# Patient Record
Sex: Female | Born: 1980
Health system: Southern US, Community
[De-identification: ages and names within clinical notes are randomized; demographics above are authoritative.]

## PROBLEM LIST (undated history)

## (undated) ENCOUNTER — Inpatient Hospital Stay (HOSPITAL_COMMUNITY): Payer: Self-pay

## (undated) DIAGNOSIS — O139 Gestational [pregnancy-induced] hypertension without significant proteinuria, unspecified trimester: Secondary | ICD-10-CM

## (undated) DIAGNOSIS — N907 Vulvar cyst: Secondary | ICD-10-CM

## (undated) DIAGNOSIS — A599 Trichomoniasis, unspecified: Secondary | ICD-10-CM

## (undated) DIAGNOSIS — N750 Cyst of Bartholin's gland: Secondary | ICD-10-CM

## (undated) DIAGNOSIS — I471 Supraventricular tachycardia, unspecified: Secondary | ICD-10-CM

## (undated) HISTORY — PX: WISDOM TOOTH EXTRACTION: SHX21

## (undated) HISTORY — DX: Gestational (pregnancy-induced) hypertension without significant proteinuria, unspecified trimester: O13.9

## (undated) HISTORY — PX: DILATION AND CURETTAGE OF UTERUS: SHX78

## (undated) HISTORY — DX: Trichomoniasis, unspecified: A59.9

## (undated) HISTORY — DX: Vulvar cyst: N90.7

---

## 2000-03-03 ENCOUNTER — Other Ambulatory Visit: Admission: RE | Admit: 2000-03-03 | Discharge: 2000-03-03 | Payer: Self-pay | Admitting: Obstetrics and Gynecology

## 2001-07-22 ENCOUNTER — Encounter: Payer: Self-pay | Admitting: Obstetrics and Gynecology

## 2001-07-22 ENCOUNTER — Inpatient Hospital Stay (HOSPITAL_COMMUNITY): Admission: AD | Admit: 2001-07-22 | Discharge: 2001-07-22 | Payer: Self-pay | Admitting: Obstetrics and Gynecology

## 2001-08-23 ENCOUNTER — Other Ambulatory Visit: Admission: RE | Admit: 2001-08-23 | Discharge: 2001-08-23 | Payer: Self-pay | Admitting: Obstetrics and Gynecology

## 2002-02-04 ENCOUNTER — Inpatient Hospital Stay (HOSPITAL_COMMUNITY): Admission: AD | Admit: 2002-02-04 | Discharge: 2002-02-04 | Payer: Self-pay | Admitting: Obstetrics and Gynecology

## 2002-03-07 ENCOUNTER — Encounter: Payer: Self-pay | Admitting: Obstetrics and Gynecology

## 2002-03-07 ENCOUNTER — Inpatient Hospital Stay (HOSPITAL_COMMUNITY): Admission: AD | Admit: 2002-03-07 | Discharge: 2002-03-07 | Payer: Self-pay | Admitting: Obstetrics and Gynecology

## 2002-03-15 ENCOUNTER — Inpatient Hospital Stay (HOSPITAL_COMMUNITY): Admission: AD | Admit: 2002-03-15 | Discharge: 2002-03-18 | Payer: Self-pay | Admitting: Obstetrics and Gynecology

## 2010-02-05 ENCOUNTER — Emergency Department (HOSPITAL_BASED_OUTPATIENT_CLINIC_OR_DEPARTMENT_OTHER): Admission: EM | Admit: 2010-02-05 | Discharge: 2010-02-05 | Payer: Self-pay | Admitting: Emergency Medicine

## 2010-07-24 ENCOUNTER — Emergency Department (HOSPITAL_BASED_OUTPATIENT_CLINIC_OR_DEPARTMENT_OTHER)
Admission: EM | Admit: 2010-07-24 | Discharge: 2010-07-24 | Payer: Self-pay | Source: Home / Self Care | Admitting: Emergency Medicine

## 2010-10-29 LAB — URINALYSIS, ROUTINE W REFLEX MICROSCOPIC
Bilirubin Urine: NEGATIVE
Glucose, UA: NEGATIVE mg/dL
Ketones, ur: NEGATIVE mg/dL
Nitrite: POSITIVE — AB
Protein, ur: NEGATIVE mg/dL
Specific Gravity, Urine: 1.004 — ABNORMAL LOW (ref 1.005–1.030)
Urobilinogen, UA: 1 mg/dL (ref 0.0–1.0)
pH: 7.5 (ref 5.0–8.0)

## 2010-10-29 LAB — URINE MICROSCOPIC-ADD ON

## 2010-10-29 LAB — PREGNANCY, URINE: Preg Test, Ur: NEGATIVE

## 2011-01-03 NOTE — H&P (Signed)
   NAMESHARISA, Robin Peters                            ACCOUNT NO.:  [id number]   MEDICAL RECORD NO.:  [id number]                   PATIENT TYPE:  INP   LOCATION:  9133                                 FACILITY:  [facility]   PHYSICIAN:  Renaldo Reel. Emilee Hero, C.N.M.             DATE OF BIRTH:  07-13-1981   DATE OF ADMISSION:  03/15/2002  DATE OF DISCHARGE:  03/18/2002                                HISTORY & PHYSICAL   No dictation                                               Chip Boer L. Emilee Hero, C.N.M.    VLL/MEDQ  D:  03/15/2002  T:  03/18/2002  Job:  [id number]

## 2011-01-03 NOTE — H&P (Signed)
[facility]  Patient:    Robin Peters, Robin Peters Visit Number: [id number] MRN: [id number]          Service Type: OBS Location: [facility] Attending Physician:  Jaymes Graff A Dictated by:   Saverio Danker, C.N.M. Admit Date:  03/07/2002                           History and Physical  HISTORY OF PRESENT ILLNESS:   Ms. Cooperwood is a 30 year old single black female, gravida 3, para 1-0-1-1, at 38-1/[redacted] weeks gestation by ultrasound who presents complaining of persistent abdominal cramping and diarrhea over the weekend. She reports that today she has had diarrhea almost every 15 minutes but states that the cramps do not feel like labor; they are just very uncomfortable. She was evaluated at [facility] office earlier this morning and was at that time found to be 2 cm and about 50% effaced.  She was subsequently sent to maternity admissions for further monitoring and evaluation.  She reports that the pain and cramps continue and at time feel like the early part of labor with her last baby.  She reports positive fetal movement but states it is a little less than usual.  She denies any headache for visual disturbances.  Her pregnancy has been followed at [facility] by the certified nurse midwife service and has been at risk for 1) questionable LMP, 2) history of preeclampsia with her last pregnancy, 3) history of postpartum depression with her last delivery, and history of tachycardia with a syncopal episode with her last pregnancy.  She has had none of that with this pregnancy.  Her group B strep was negative.  OBSTETRICAL/GYNECOLOGIC HISTORY:  She is a gravida 3, para 1-0-1-1 who had a miscarriage in June 1998 with no complications.  She delivered a viable female infant in May 1999 who weighed 7 pounds 6 ounces at [redacted] weeks gestation following a 6-hour labor.  She was induced with that pregnancy  for preeclampsia.  She reports that she did have postpartum depression but did not require treatment.  ALLERGIES:                    No known drug allergies.  PAST MEDICAL HISTORY:         She reports having had the usual childhood diseases.  She reports a history of varicose veins and an episode of tachycardia with her last pregnancy that caused syncope but had no other issues and has not had any of those symptoms with this pregnancy.  She has reported two kidney infections in the past, occasional urinary tract infection, and a history of ulcers five to six years ago.  Her only surgery was for wisdom teeth removed at age 60.  FAMILY HISTORY:               Unknown as patient is adopted.  GENETIC HISTORY:              Negative.  SOCIAL HISTORY:               She is single.  The father of the baby is Daphine Deutscher who is involved and supportive.  They are both employed full time.  They deny illicit drug use, alcohol, or smoking with this pregnancy.  PRENATAL LABORATORY DATA:     Her blood type is A positive.  Her antibody screen is negative.  Sickle cell  trait is negative.  Syphilis is nonreactive. Rubella is immune.  Hepatitis B surface antigen is negative.  HIV is nonreactive.  GC and chlamydia were both negative.  Pap was within normal limits.  One-hour glucola was elevated at 144.  Three-hour GTT was within normal range.  Her maternal serum alpha-fetoprotein was within normal range, and her Group B strep was negative.  PHYSICAL EXAMINATION:  VITAL SIGNS:                  Stable. She is afebrile.  HEENT:                        Grossly within normal limits.  HEART:                        Regular rhythm and rate.  CHEST:                        Clear.  BREASTS:                      Soft and nontender.  ABDOMEN:                      Gravid.  Fetal heart rate is overall reactive and reassuring, though she did have a single variable down to the 80s that lasted for approximately 3  minutes total then returned spontaneously to baseline and has subsequently been reactive and reassuring.  She has had uterine contractions throughout her observation initially every 2 to 3 minutes but after IV fluids, every 5 to 7 minutes and are mild to moderate.  PELVIC:                       Her cervix is still 2, 50%, vertex, -1, anterior, with intact membranes.  EXTREMITIES:                  Within normal limits.  LABORATORY DATA:              Urinalysis was negative.   Her lipase is 19. Her amylase is 103.  SGOT 13, SGPT 8.  WBC count 6.4, platelets 197.  ASSESSMENT:                   1. Intrauterine pregnancy at 38-1/[redacted] weeks                                  gestation.                               2. Probable gastrointestinal virus.                               3. Questionable prodromal labor.  PLAN:                         Per consultation with Dr. Normand Sloop is to admit for 23-hour observation for therapeutic rest, biophysical profile, and AFI within normal limits.  Will also continue IV hydration. Dictated by:   Vance Gather Duplantis, C.N.M. Attending Physician:  Michael Litter DD:  03/07/02 TD:  03/07/02 Job: [id number]

## 2011-01-03 NOTE — H&P (Signed)
[name], Robin Peters                            ACCOUNT NO.:  [id number]   MEDICAL RECORD NO.:  [id number]                   PATIENT TYPE:  INP   LOCATION:  9169                                 FACILITY:  [facility]   PHYSICIAN:  Renaldo Reel. Emilee Hero, C.N.M.             DATE OF BIRTH:  10/28/1980   DATE OF ADMISSION:  03/15/2002  DATE OF DISCHARGE:                                HISTORY & PHYSICAL   HISTORY OF PRESENT ILLNESS:  The patient is a 30 year old, gravida 3, para 1-  0-1-1 at 39-5/7th weeks who presents from the office with a history of a  nonreactive NST.  Blood pressures in the 130/90 and 122/86 range and uterine  contractions every three minutes.  She denies any headache, vaginal  bleeding, or epigastric pain.  The pregnancy has been remarkable for history  of PIH with her last pregnancy, questionable last menstrual period, history  of postpartum depression, history of tachycardia with syncope in her  previous pregnancy for which she was treated with Inderal but had no  pathological diagnosis, pelvic pain in the third trimester which makes it  very difficult for the patient to walk.   PRENATAL LABORATORY DATA:  Blood type is A positive.  Rh antibody negative.  VDRL nonreactive.  Rubella titer positive.  Hepatitis B surface antigen  negative.  Sickle cell test was negative.  HIV was nonreactive.  Pap was  normal.  GC and Chlamydia cultures were negative.  Glucose challenge was  elevated at 144, three hour GTT was normal, AFP was normal.  Hemoglobin upon  entering the practice was 13.3.  It was 10.9 at 27 weeks.  EDC of March 17, 2002 was established by ultrasound at six weeks secondary to questionable  LMP.  Group B strep culture was negative at 36 weeks.   PRESENT OBSTETRICAL HISTORY:  The patient had entered here at approximately  10 to 11 weeks.  A release of information was sent to her previous  cardiologist in [facility] for follow up on her tachycardia and syncope from  her  previous pregnancy.  She had sporadic sinus tachycardia and was treated  with Inderal during that pregnancy.  She had an ultrasound at 19 weeks.  After her six week ultrasound that showed normal growth and fluid, she had  an elevated one hour GTT that was abnormal and a three hour GTT that was  normal.  She was placed on Zantac at 28 weeks for reflux.  The rest of her  pregnancy was essentially uncomplicated except for significant pelvic pubic  symphysis pain that occurred in the latter part of her pregnancy.  She also  continued to have some diarrhea sporadically.  She was seen at maternity  admissions at 38 weeks for significant left lower quadrant pain.  Findings  were negative.  Her blood pressure at the office today was 130/90 and then  122/86.  She  was seen for serial BP, catheterized UA, and PIH labs.  She was  also found to be 3 cm and contracting.   PAST OBSTETRICAL HISTORY:  In 1998, she had a spontaneous miscarriage at 8  weeks and in 1999 she had a vaginal birth of a female infant, weight 7  pounds 6 ounces, at 39 weeks.  She was in labor six hours.  She had epidural  anesthesia.  She was induced for PIH and had no complications.  She did have  some postpartum blues after that delivery but no other problems.   PAST MEDICAL HISTORY:  She is a previous Ortho-Tri-Cyclen user.  She has  occasional yeast infections and had BV x 1.  She reports usual childhood  illnesses.  She had tachycardia with syncope last pregnancy and was treated  with bed rest and monitored her medication.  She has a varicose vein.  She  had a history of questionable ulcers five to six years ago.  She has  occasional UTIs.  She has had pyelonephritis x 2 that was treated with  outpatient antibiotics by mouth.   PAST SURGICAL HISTORY:  Surgical history includes wisdom teeth removed at  age 16.  The only other hospitalization was for childbirth.   GENETIC HISTORY:  Unremarkable in terms of known history;  however, the  patient is adopted.   FAMILY HISTORY:  Unknown.   SOCIAL HISTORY:  The patient is single.  The father of the baby is involved  and supportive.  His name is Medical sales representative.  He is involved and  supportive.  He is African-American.  The patient has a high school  education.  He is employed in [employer].  Her partner has one year of  Scientist, product/process development.  He is Veterinary surgeon.  She has been followed by the  certified nurse midwife service at [facility].  She denies any  alcohol, drug, or tobacco use during this pregnancy.   PHYSICAL EXAMINATION:   VITAL SIGNS:  Blood pressures range from 110/70 to 140/90.  Other vital  signs are stable.   HEENT:  Within normal limits.   LUNGS:  Breath sounds are clear.   HEART:  Regular rate and rhythm without murmur.   BREASTS:  Soft and nontender.   ABDOMEN:  Fundal height is approximately 38 cm.  Estimated fetal weight is 7  to 8 pounds.  Uterine contractions every three to five minutes, mild to  moderate quality.   PELVIC:  Cervical examination is 3, 60% vertex, and at a -1 station with a  bulging bag of water.  Her cervix had been 2 cm.  After two hours of  observation, the cervix remained essentially the same.  The uterine  contractions then were every three minutes, more consistently.   NEUROLOGIC:  Deep tendon reflexes were 2+ without clonus with a trace edema  noted.   LABORATORY DATA:  Catheterization UA shows a specific gravity of 1010 and 15  of ketones.  No protein was noted.  The pH labs were all within normal  limits.  Fetal monitor shows reactive fetal heart rate tracing with no  decelerations.   IMPRESSION:  1. Intrauterine pregnancy at 39-5/7th weeks.  2. Labile blood pressures.  3. Probable early labor.  4. History of pregnancy-induced hypertension.   PLAN:  1. Admit to birthing suite per consult with Dr. Pierre Bali. Dillard, who is    attending physician.  2. Routine certified nurse midwife  orders.  3. Plan  for labor support with artificial rupture of membranes or Pitocin     augmentation p.r.n. and with close observation of maternal fetal status     and reevaluation of blood pressure after delivery.                                              Renaldo Reel Emilee Hero, C.N.M.   VLL/MEDQ  D:  03/15/2002  T:  03/15/2002  Job:  [id number]

## 2012-02-14 ENCOUNTER — Emergency Department (HOSPITAL_BASED_OUTPATIENT_CLINIC_OR_DEPARTMENT_OTHER): Payer: Self-pay

## 2012-02-14 ENCOUNTER — Emergency Department (HOSPITAL_BASED_OUTPATIENT_CLINIC_OR_DEPARTMENT_OTHER)
Admission: EM | Admit: 2012-02-14 | Discharge: 2012-02-14 | Disposition: A | Discharge status: Home / Self Care | Payer: Self-pay | Attending: Emergency Medicine | Admitting: Emergency Medicine

## 2012-02-14 DIAGNOSIS — A599 Trichomoniasis, unspecified: Secondary | ICD-10-CM | POA: Insufficient documentation

## 2012-02-14 DIAGNOSIS — N907 Vulvar cyst: Secondary | ICD-10-CM

## 2012-02-14 DIAGNOSIS — O98819 Other maternal infectious and parasitic diseases complicating pregnancy, unspecified trimester: Secondary | ICD-10-CM | POA: Insufficient documentation

## 2012-02-14 DIAGNOSIS — Z3490 Encounter for supervision of normal pregnancy, unspecified, unspecified trimester: Secondary | ICD-10-CM

## 2012-02-14 DIAGNOSIS — R609 Edema, unspecified: Secondary | ICD-10-CM | POA: Insufficient documentation

## 2012-02-14 LAB — CBC WITH DIFFERENTIAL/PLATELET
Basophils Absolute: 0 10*3/uL (ref 0.0–0.1)
Eosinophils Relative: 1 % (ref 0–5)
Lymphocytes Relative: 20 % (ref 12–46)
Neutro Abs: 4.5 10*3/uL (ref 1.7–7.7)
Neutrophils Relative %: 72 % (ref 43–77)
Platelets: 302 10*3/uL (ref 150–400)
RDW: 12.6 % (ref 11.5–15.5)
WBC: 6.2 10*3/uL (ref 4.0–10.5)

## 2012-02-14 LAB — WET PREP, GENITAL: Yeast Wet Prep HPF POC: NONE SEEN

## 2012-02-14 LAB — BASIC METABOLIC PANEL
CO2: 21 mEq/L (ref 19–32)
Calcium: 9.2 mg/dL (ref 8.4–10.5)
Sodium: 137 mEq/L (ref 135–145)

## 2012-02-14 LAB — ABO/RH: ABO/RH(D): A POS

## 2012-02-14 LAB — URINALYSIS, ROUTINE W REFLEX MICROSCOPIC
Glucose, UA: NEGATIVE mg/dL
Hgb urine dipstick: NEGATIVE
Ketones, ur: NEGATIVE mg/dL
Protein, ur: NEGATIVE mg/dL

## 2012-02-14 LAB — HCG, QUANTITATIVE, PREGNANCY: hCG, Beta Chain, Quant, S: 66188 m[IU]/mL — ABNORMAL HIGH (ref <5)

## 2012-02-14 LAB — OB RESULTS CONSOLE RPR: RPR: NONREACTIVE

## 2012-02-14 MED ORDER — METRONIDAZOLE 500 MG PO TABS
2000.0000 mg | ORAL_TABLET | Freq: Once | ORAL | Status: AC
  Administered 2012-02-14: 2000 mg via ORAL
  Filled 2012-02-14: qty 4

## 2012-02-14 NOTE — ED Provider Notes (Signed)
History     CSN: [id number]  Arrival date & time 02/14/12  1215   First MD Initiated Contact with Patient 02/14/12 1228      Chief Complaint  Patient presents with  . Abdominal Pain  . Edema  . Vaginal Bleeding    (Consider location/radiation/quality/duration/timing/severity/associated sxs/prior treatment) HPI Comments: Pt states that she is bleeding intermittently:pt states that she was yesterday, but she doesn't think she is today:pt states that she has had intermittent swelling to her labia over the last couple of months:pt states that she started having generalized lower abdominal pain in the last couple of days:pt states that she had an abortion in January and she had a normal period in April but has not had consistent bleeding since then  Patient is a 30 y.o. female presenting with abdominal pain. The history is provided by the patient. No language interpreter was used.  Abdominal Pain The primary symptoms of the illness include abdominal pain, dysuria and vaginal bleeding. The primary symptoms of the illness do not include fever, nausea, vomiting or diarrhea. The current episode started more than 2 days ago. The onset of the illness was gradual. The problem has not changed since onset.   No past medical history on file.  No past surgical history on file.  No family history on file.  History  Substance Use Topics  . Smoking status: Not on file  . Smokeless tobacco: Not on file  . Alcohol Use: Not on file    OB History    No data available      Review of Systems  Constitutional: Negative for fever.  Respiratory: Negative.   Cardiovascular: Negative.   Gastrointestinal: Positive for abdominal pain. Negative for nausea, vomiting and diarrhea.  Genitourinary: Positive for dysuria and vaginal bleeding.    Allergies  Review of patient's allergies indicates no known allergies.  Home Medications  No current outpatient prescriptions on file.  LMP  12/15/2011  Physical Exam  Nursing note and vitals reviewed. Constitutional: She is oriented to person, place, and time. She appears well-developed and well-nourished.  Cardiovascular: Normal rate and regular rhythm.   Pulmonary/Chest: Effort normal and breath sounds normal.  Abdominal: Soft. Bowel sounds are normal. There is tenderness in the left lower quadrant.  Genitourinary:       Pt has a fluctuant no red area to the labia:area is non tender to palpation:pt has malodorous discharge:-cmt  Musculoskeletal: Normal range of motion.  Neurological: She is alert and oriented to person, place, and time.  Skin: Skin is warm and dry.  Psychiatric: She has a normal mood and affect.    ED Course  Procedures (including critical care time)  Labs Reviewed  PREGNANCY, URINE - Abnormal; Notable for the following:    Preg Test, Ur POSITIVE (*)     All other components within normal limits  WET PREP, GENITAL - Abnormal; Notable for the following:    Trich, Wet Prep FEW (*)     Clue Cells Wet Prep HPF POC FEW (*)     WBC, Wet Prep HPF POC MODERATE (*)     All other components within normal limits  BASIC METABOLIC PANEL - Abnormal; Notable for the following:    Potassium 3.3 (*)     All other components within normal limits  HCG, QUANTITATIVE, PREGNANCY - Abnormal; Notable for the following:    hCG, Beta Chain, [place] [address] (*)     All other components within normal limits  URINALYSIS, ROUTINE W REFLEX  MICROSCOPIC  CBC WITH DIFFERENTIAL  ABO/RH  GC/CHLAMYDIA PROBE AMP, GENITAL   US Ob Comp Less 14 Wks  02/14/2012  *RADIOLOGY REPORT*  Clinical Data: Vaginal bleeding.  OBSTETRIC <14 WK ULTRASOUND, TRANSVAGINAL OB US  Technique:  Transabdominal and transvaginal ultrasound was performed for evaluation of the gestation as well as the maternal uterus and adnexal regions.  Findings:  There is a single intrauterine gestational sac containing embryo. The cardiac activity is identified.  The heart  rate is equal to 160 beats per minute.  Crown-rump length measures 46.6 mm.  This corresponds to a 11-week- 3-day gestation.  [facility] 09/01/2012.  Maternal uterus/adnexae: The ovaries appear normal.  There is no free fluid.  IMPRESSION:  1.  Single living intrauterine gestation with an estimated gestational age of [redacted] weeks and 3 days.  This is concordant with the clinical gestational age.  Original Report Authenticated By: Rosealee Albee, M.D.     1. Pregnancy   2. Trichimoniasis   3. Labial cyst       MDM  Discussed pregnancy with pt and follow up for care:pt instructed to take vitamins:pt treated here for trich:other cultures sent        Teressa Lower, NP 02/14/12 1552

## 2012-02-14 NOTE — ED Notes (Signed)
Lower abdominal pain, swelling to labia, vaginal bleeding intermittently x several days.  Foul smelling urine.

## 2012-02-14 NOTE — ED Notes (Signed)
Pt given instructions for f/u- no new rx given

## 2012-02-14 NOTE — Discharge Instructions (Signed)
ABCs of Pregnancy A Antepartum care is very important. Be sure you see your doctor and get prenatal care as soon as you think you are pregnant. At this time, you will be tested for infection, genetic abnormalities and potential problems with you and the pregnancy. This is the time to discuss diet, exercise, work, medications, labor, pain medication during labor and the possibility of a cesarean delivery. Ask any questions that may concern you. It is important to see your doctor regularly throughout your pregnancy. Avoid exposure to toxic substances and chemicals - such as cleaning solvents, lead and mercury, some insecticides, and paint. Pregnant women should avoid exposure to paint fumes, and fumes that cause you to feel ill, dizzy or faint. When possible, it is a good idea to have a pre-pregnancy consultation with your caregiver to begin some important recommendations your caregiver suggests such as, taking folic acid, exercising, quitting smoking, avoiding alcoholic beverages, etc. B Breastfeeding is the healthiest choice for both you and your baby. It has many nutritional benefits for the baby and health benefits for the mother. It also creates a very tight and loving bond between the baby and mother. Talk to your doctor, your family and friends, and your employer about how you choose to feed your baby and how they can support you in your decision. Not all birth defects can be prevented, but a woman can take actions that may increase her chance of having a healthy baby. Many birth defects happen very early in pregnancy, sometimes before a woman even knows she is pregnant. Birth defects or abnormalities of any child in your or the father's family should be discussed with your caregiver. Get a good support bra as your breast size changes. Wear it especially when you exercise and when nursing.  C Celebrate the news of your pregnancy with the your spouse/father and family. Childbirth classes are helpful to  take for you and the spouse/father because it helps to understand what happens during the pregnancy, labor and delivery. Cesarean delivery should be discussed with your doctor so you are prepared for that possibility. The pros and cons of circumcision if it is a boy, should be discussed with your pediatrician. Cigarette smoking during pregnancy can result in low birth weight babies. It has been associated with infertility, miscarriages, tubal pregnancies, infant death (mortality) and poor health (morbidity) in childhood. Additionally, cigarette smoking may cause long-term learning disabilities. If you smoke, you should try to quit before getting pregnant and not smoke during the pregnancy. Secondary smoke may also harm a mother and her developing baby. It is a good idea to ask people to stop smoking around you during your pregnancy and after the baby is born. Extra calcium is necessary when you are pregnant and is found in your prenatal vitamin, in dairy products, green leafy vegetables and in calcium supplements. D A healthy diet according to your current weight and height, along with vitamins and mineral supplements should be discussed with your caregiver. Domestic abuse or violence should be made known to your doctor right away to get the situation corrected. Drink more water when you exercise to keep hydrated. Discomfort of your back and legs usually develops and progresses from the middle of the second trimester through to delivery of the baby. This is because of the enlarging baby and uterus, which may also affect your balance. Do not take illegal drugs. Illegal drugs can seriously harm the baby and you. Drink extra fluids (water is best) throughout pregnancy to help  your body keep up with the increases in your blood volume. Drink at least 6 to 8 glasses of water, fruit juice, or milk each day. A good way to know you are drinking enough fluid is when your urine looks almost like clear water or is very light  yellow.  E Eat healthy to get the nutrients you and your unborn baby need. Your meals should include the five basic food groups. Exercise (30 minutes of light to moderate exercise a day) is important and encouraged during pregnancy, if there are no medical problems or problems with the pregnancy. Exercise that causes discomfort or dizziness should be stopped and reported to your caregiver. Emotions during pregnancy can change from being ecstatic to depression and should be understood by you, your partner and your family. F Fetal screening with ultrasound, amniocentesis and monitoring during pregnancy and labor is common and sometimes necessary. Take 400 micrograms of folic acid daily both before, when possible, and during the first few months of pregnancy to reduce the risk of birth defects of the brain and spine. All women who could possibly become pregnant should take a vitamin with folic acid, every day. It is also important to eat a healthy diet with fortified foods (enriched grain products, including cereals, rice, breads, and pastas) and foods with natural sources of folate (orange juice, green leafy vegetables, beans, peanuts, broccoli, asparagus, peas, and lentils). The father should be involved with all aspects of the pregnancy including, the prenatal care, childbirth classes, labor, delivery, and postpartum time. Fathers may also have emotional concerns about being a father, financial needs, and raising a family. G Genetic testing should be done appropriately. It is important to know your family and the father's history. If there have been problems with pregnancies or birth defects in your family, report these to your doctor. Also, genetic counselors can talk with you about the information you might need in making decisions about having a family. You can call a major medical center in your area for help in finding a board-certified genetic counselor. Genetic testing and counseling should be done  before pregnancy when possible, especially if there is a history of problems in the mother's or father's family. Certain ethnic backgrounds are more at risk for genetic defects. H Get familiar with the hospital where you will be having your baby. Get to know how long it takes to get there, the labor and delivery area, and the hospital procedures. Be sure your medical insurance is accepted there. Get your home ready for the baby including, clothes, the baby's room (when possible), furniture and car seat. Hand washing is important throughout the day, especially after handling raw meat and poultry, changing the baby's diaper or using the bathroom. This can help prevent the spread of many bacteria and viruses that cause infection. Your hair may become dry and thinner, but will return to normal a few weeks after the baby is born. Heartburn is a common problem that can be treated by taking antacids recommended by your caregiver, eating smaller meals 5 or 6 times a day, not drinking liquids when eating, drinking between meals and raising the head of your bed 2 to 3 inches. I Insurance to cover you, the baby, doctor and hospital should be reviewed so that you will be prepared to pay any costs not covered by your insurance plan. If you do not have medical insurance, there are usually clinics and services available for you in your community. Take 30 milligrams of iron during  your pregnancy as prescribed by your doctor to reduce the risk of low red blood cells (anemia) later in pregnancy. All women of childbearing age should eat a diet rich in iron. J There should be a joint effort for the mother, father and any other children to adapt to the pregnancy financially, emotionally, and psychologically during the pregnancy. Join a support group for moms-to-be. Or, join a class on parenting or childbirth. Have the family participate when possible. K Know your limits. Let your caregiver know if you experience any of the  following:   Pain of any kind.   Strong cramps.   You develop a lot of weight in a short period of time (5 pounds in 3 to 5 days).   Vaginal bleeding, leaking of amniotic fluid.   Headache, vision problems.   Dizziness, fainting, shortness of breath.   Chest pain.   Fever of 102 F (38.9 C) or higher.   Gush of clear fluid from your vagina.   Painful urination.   Domestic violence.   Irregular heartbeat (palpitations).   Rapid beating of the heart (tachycardia).   Constant feeling sick to your stomach (nauseous) and vomiting.   Trouble walking, fluid retention (edema).   Muscle weakness.   If your baby has decreased activity.   Persistent diarrhea.   Abnormal vaginal discharge.   Uterine contractions at 20-minute intervals.   Back pain that travels down your leg.  L Learn and practice that what you eat and drink should be in moderation and healthy for you and your baby. Legal drugs such as alcohol and caffeine are important issues for pregnant women. There is no safe amount of alcohol a woman can drink while pregnant. Fetal alcohol syndrome, a disorder characterized by growth retardation, facial abnormalities, and central nervous system dysfunction, is caused by a woman's use of alcohol during pregnancy. Caffeine, found in tea, coffee, soft drinks and chocolate, should also be limited. Be sure to read labels when trying to cut down on caffeine during pregnancy. More than 200 foods, beverages, and over-the-counter medications contain caffeine and have a high salt content! There are coffees and teas that do not contain caffeine. M Medical conditions such as diabetes, epilepsy, and high blood pressure should be treated and kept under control before pregnancy when possible, but especially during pregnancy. Ask your caregiver about any medications that may need to be changed or adjusted during pregnancy. If you are currently taking any medications, ask your caregiver if it  is safe to take them while you are pregnant or before getting pregnant when possible. Also, be sure to discuss any herbs or vitamins you are taking. They are medicines, too! Discuss with your doctor all medications, prescribed and over-the-counter, that you are taking. During your prenatal visit, discuss the medications your doctor may give you during labor and delivery. N Never be afraid to ask your doctor or caregiver questions about your health, the progress of the pregnancy, family problems, stressful situations, and recommendation for a pediatrician, if you do not have one. It is better to take all precautions and discuss any questions or concerns you may have during your office visits. It is a good idea to write down your questions before you visit the doctor. O Over-the-counter cough and cold remedies may contain alcohol or other ingredients that should be avoided during pregnancy. Ask your caregiver about prescription, herbs or over-the-counter medications that you are taking or may consider taking while pregnant.  P Physical activity during pregnancy can  benefit both you and your baby by lessening discomfort and fatigue, providing a sense of well-being, and increasing the likelihood of early recovery after delivery. Light to moderate exercise during pregnancy strengthens the belly (abdominal) and back muscles. This helps improve posture. Practicing yoga, walking, swimming, and cycling on a stationary bicycle are usually safe exercises for pregnant women. Avoid scuba diving, exercise at high altitudes (over 3000 feet), skiing, horseback riding, contact sports, etc. Always check with your doctor before beginning any kind of exercise, especially during pregnancy and especially if you did not exercise before getting pregnant. Q Queasiness, stomach upset and morning sickness are common during pregnancy. Eating a couple of crackers or dry toast before getting out of bed. Foods that you normally love may  make you feel sick to your stomach. You may need to substitute other nutritious foods. Eating 5 or 6 small meals a day instead of 3 large ones may make you feel better. Do not drink with your meals, drink between meals. Questions that you have should be written down and asked during your prenatal visits. R Read about and make plans to baby-proof your home. There are important tips for making your home a safer environment for your baby. Review the tips and make your home safer for you and your baby. Read food labels regarding calories, salt and fat content in the food. S Saunas, hot tubs, and steam rooms should be avoided while you are pregnant. Excessive high heat may be harmful during your pregnancy. Your caregiver will screen and examine you for sexually transmitted diseases and genetic disorders during your prenatal visits. Learn the signs of labor. Sexual relations while pregnant is safe unless there is a medical or pregnancy problem and your caregiver advises against it. T Traveling long distances should be avoided especially in the third trimester of your pregnancy. If you do have to travel out of state, be sure to take a copy of your medical records and medical insurance plan with you. You should not travel long distances without seeing your doctor first. Most airlines will not allow you to travel after 36 weeks of pregnancy. Toxoplasmosis is an infection caused by a parasite that can seriously harm an unborn baby. Avoid eating undercooked meat and handling cat litter. Be sure to wear gloves when gardening. Tingling of the hands and fingers is not unusual and is due to fluid retention. This will go away after the baby is born. U Womb (uterus) size increases during the first trimester. Your kidneys will begin to function more efficiently. This may cause you to feel the need to urinate more often. You may also leak urine when sneezing, coughing or laughing. This is due to the growing uterus pressing  against your bladder, which lies directly in front of and slightly under the uterus during the first few months of pregnancy. If you experience burning along with frequency of urination or bloody urine, be sure to tell your doctor. The size of your uterus in the third trimester may cause a problem with your balance. It is advisable to maintain good posture and avoid wearing high heels during this time. An ultrasound of your baby may be necessary during your pregnancy and is safe for you and your baby. V Vaccinations are an important concern for pregnant women. Get needed vaccines before pregnancy. Center for Disease Control (http://www.wolf.info/) has clear guidelines for the use of vaccines during pregnancy. Review the list, be sure to discuss it with your doctor. Prenatal vitamins are helpful  and healthy for you and the baby. Do not take extra vitamins except what is recommended. Taking too much of certain vitamins can cause overdose problems. Continuous vomiting should be reported to your caregiver. Varicose veins may appear especially if there is a family history of varicose veins. They should subside after the delivery of the baby. Support hose helps if there is leg discomfort. W Being overweight or underweight during pregnancy may cause problems. Try to get within 15 pounds of your ideal weight before pregnancy. Remember, pregnancy is not a time to be dieting! Do not stop eating or start skipping meals as your weight increases. Both you and your baby need the calories and nutrition you receive from a healthy diet. Be sure to consult with your doctor about your diet. There is a formula and diet plan available depending on whether you are overweight or underweight. Your caregiver or nutritionist can help and advise you if necessary. X Avoid X-rays. If you must have dental work or diagnostic tests, tell your dentist or physician that you are pregnant so that extra care can be taken. X-rays should only be taken when  the risks of not taking them outweigh the risk of taking them. If needed, only the minimum amount of radiation should be used. When X-rays are necessary, protective lead shields should be used to cover areas of the body that are not being X-rayed. Y Your baby loves you. Breastfeeding your baby creates a loving and very close bond between the two of you. Give your baby a healthy environment to live in while you are pregnant. Infants and children require constant care and guidance. Their health and safety should be carefully watched at all times. After the baby is born, rest or take a nap when the baby is sleeping. Z Get your ZZZs. Be sure to get plenty of rest. Resting on your side as often as possible, especially on your left side is advised. It provides the best circulation to your baby and helps reduce swelling. Try taking a nap for 30 to 45 minutes in the afternoon when possible. After the baby is born rest or take a nap when the baby is sleeping. Try elevating your feet for that amount of time when possible. It helps the circulation in your legs and helps reduce swelling.  Most information courtesy of the CDC. Document Released: 08/04/2005 Document Revised: 07/24/2011 Document Reviewed: 04/18/2009 [facility] Patient Information 2012 [place], [place].Pregnancy If you are planning on getting pregnant, it is a good idea to make a preconception appointment with your care- giver to discuss having a healthy lifestyle before getting pregnant. Such as, diet, weight, exercise, taking prenatal vitamins especially folic acid (it helps prevent brain and spinal cord defects), avoiding alcohol, smoking and illegal drugs, medical problems (diabetes, convulsions), family history of genetic problems, working conditions and immunizations. It is better to have knowledge of these things and do something about them before getting pregnant. In your pregnancy, it is important to follow certain guidelines to have a healthy baby.  It is very important to get good prenatal care and follow your caregiver's instructions. Prenatal care includes all the medical care you receive before your baby's birth. This helps to prevent problems during the pregnancy and childbirth. HOME CARE INSTRUCTIONS   Start your prenatal visits by the 12th week of pregnancy or before when possible. They are usually scheduled monthly at first. They are more often in the last 2 months before delivery. It is important that you keep your  caregiver's appointments and follow your caregiver's instructions regarding medication use, exercise, and diet.   During pregnancy, you are providing food for you and your baby. Eat a regular, well-balanced diet. Choose foods such as meat, fish, milk and other dairy products, vegetables, fruits, whole-grain breads and cereals. Your caregiver will inform you of the ideal weight gain depending on your current height and weight. Drink lots of liquids. Try to drink 8 glasses of water a day.   Alcohol is associated with a number of birth defects including fetal alcohol syndrome. It is best to avoid alcohol completely. Smoking will cause low birth rate and prematurity. Use of alcohol and nicotine during your pregnancy also increases the chances that your child will be chemically dependent later in their life and may contribute to SIDS (Sudden Infant Death Syndrome).   Do not use illegal drugs.   Only take prescription or over-the-counter medications that are recommended by your caregiver. Other medications can cause genetic and physical problems in the baby.   Morning sickness can often be helped by keeping soda crackers at the bedside. Eat a couple before arising in the morning.   A sexual relationship may be continued until near the end of pregnancy if there are no other problems such as early (premature) leaking of amniotic fluid from the membranes, vaginal bleeding, painful intercourse or belly (abdominal) pain.   Exercise  regularly. Check with your caregiver if you are unsure of the safety of some of your exercises.   Do not use hot tubs, steam rooms or saunas. These increase the risk of fainting or passing out and hurting yourself and the baby. Swimming is OK for exercise. Get plenty of rest, including afternoon naps when possible especially in the third trimester.   Avoid toxic odors and chemicals.   Do not wear high heels. They may cause you to lose your balance and fall.   Do not lift over 5 pounds. If you do lift anything, lift with your legs and thighs, not your back.   Avoid long trips, especially in the third trimester.   If you have to travel out of the city or state, take a copy of your medical records with you.  SEEK IMMEDIATE MEDICAL CARE IF:   You develop an unexplained oral temperature above 102 F (38.9 C), or as your caregiver suggests.   You have leaking of fluid from the vagina. If leaking membranes are suspected, take your temperature and inform your caregiver of this when you call.   There is vaginal spotting or bleeding. Notify your caregiver of the amount and how many pads are used.   You continue to feel sick to your stomach (nauseous) and have no relief from remedies suggested, or you throw up (vomit) blood or coffee ground like materials.   You develop upper abdominal pain.   You have round ligament discomfort in the lower abdominal area. This still must be evaluated by your caregiver.   You feel contractions of the uterus.   You do not feel the baby move, or there is less movement than before.   You have painful urination.   You have abnormal vaginal discharge.   You have persistent diarrhea.   You get a severe headache.   You have problems with your vision.   You develop muscle weakness.   You feel dizzy and faint.   You develop shortness of breath.   You develop chest pain.   You have back pain that travels down to your  leg and feet.   You feel irregular  or a very fast heartbeat.   You develop excessive weight gain in a short period of time (5 pounds in 3 to 5 days).   You are involved with a domestic violence situation.  Document Released: 08/04/2005 Document Revised: 07/24/2011 Document Reviewed: 01/26/2009 [facility] Patient Information 2012 [place], [place].

## 2012-02-14 NOTE — ED Notes (Signed)
The pelvic cart is set up and ready for the doctor to use.

## 2012-02-18 NOTE — ED Provider Notes (Signed)
Medical screening examination/treatment/procedure(s) were performed by non-physician practitioner and as supervising physician I was immediately available for consultation/collaboration.  Donovan Gatchel, MD 02/18/12 1316 

## 2012-03-24 ENCOUNTER — Inpatient Hospital Stay (HOSPITAL_COMMUNITY)
Admission: AD | Admit: 2012-03-24 | Discharge: 2012-03-24 | Disposition: A | Discharge status: Home / Self Care | Payer: Medicaid Other | Source: Ambulatory Visit | Attending: Obstetrics and Gynecology | Admitting: Obstetrics and Gynecology

## 2012-03-24 ENCOUNTER — Encounter (HOSPITAL_COMMUNITY): Payer: Self-pay

## 2012-03-24 ENCOUNTER — Inpatient Hospital Stay (HOSPITAL_COMMUNITY): Payer: Medicaid Other

## 2012-03-24 DIAGNOSIS — O26899 Other specified pregnancy related conditions, unspecified trimester: Secondary | ICD-10-CM

## 2012-03-24 DIAGNOSIS — A499 Bacterial infection, unspecified: Secondary | ICD-10-CM | POA: Insufficient documentation

## 2012-03-24 DIAGNOSIS — O2390 Unspecified genitourinary tract infection in pregnancy, unspecified trimester: Secondary | ICD-10-CM | POA: Insufficient documentation

## 2012-03-24 DIAGNOSIS — R109 Unspecified abdominal pain: Secondary | ICD-10-CM | POA: Insufficient documentation

## 2012-03-24 DIAGNOSIS — B9689 Other specified bacterial agents as the cause of diseases classified elsewhere: Secondary | ICD-10-CM | POA: Insufficient documentation

## 2012-03-24 DIAGNOSIS — N760 Acute vaginitis: Secondary | ICD-10-CM | POA: Insufficient documentation

## 2012-03-24 HISTORY — DX: Cyst of Bartholin's gland: N75.0

## 2012-03-24 LAB — URINALYSIS, ROUTINE W REFLEX MICROSCOPIC
Glucose, UA: NEGATIVE mg/dL
Leukocytes, UA: NEGATIVE
Specific Gravity, Urine: 1.02 (ref 1.005–1.030)
pH: 7 (ref 5.0–8.0)

## 2012-03-24 LAB — CBC
MCH: 30.3 pg (ref 26.0–34.0)
MCHC: 33.9 g/dL (ref 30.0–36.0)
Platelets: 313 10*3/uL (ref 150–400)
RBC: 3.8 MIL/uL — ABNORMAL LOW (ref 3.87–5.11)

## 2012-03-24 LAB — WET PREP, GENITAL

## 2012-03-24 LAB — HCG, QUANTITATIVE, PREGNANCY: hCG, Beta Chain, Quant, S: 13975 m[IU]/mL — ABNORMAL HIGH (ref <5)

## 2012-03-24 MED ORDER — METRONIDAZOLE 500 MG PO TABS: 500.0000 mg | ORAL_TABLET | Freq: Two times a day (BID) | ORAL | Status: AC

## 2012-03-24 NOTE — MAU Note (Signed)
Pt states last Thursday went to [facility] after gushing blood. Told she had a threatened miscarriage. Now when walking feels cramping, and began spotting today. Denies uti s/s, does feel pressure when she voids and when walking.

## 2012-03-24 NOTE — MAU Provider Note (Signed)
History     CSN: [id number]  Arrival date and time: 03/24/12 1325   None     No chief complaint on file.  HPI Pt is pregnant unsure of dates- was seen at [facility] and told she had a threatened miscarriage but that her cervix was closed- no ultrasound was performed.  Pt gives history of gush of blood and cramping.  Past Medical History  Diagnosis Date  . Bartholin cyst     Past Surgical History  Procedure Date  . Wisdom tooth extraction     History reviewed. No pertinent family history.  History  Substance Use Topics  . Smoking status: Former Smoker -- 0.2 packs/day    Quit date: 01/23/2012  . Smokeless tobacco: Not on file  . Alcohol Use: No    Allergies: No Known Allergies  Prescriptions prior to admission  Medication Sig Dispense Refill  . acetaminophen (TYLENOL) 500 MG tablet Take 500 mg by mouth every 6 (six) hours as needed. For pain        ROS Physical Exam   Blood pressure 135/75, pulse 77, temperature 98.1 F (36.7 C), temperature source Oral, resp. rate 16, height 5\' 7"  (1.702 m), weight 92.08 kg (203 lb), last menstrual period 01/23/2012, SpO2 100.00%, unknown if currently breastfeeding.  Physical Exam  Vitals reviewed. Constitutional: She is oriented to person, place, and time. She appears well-developed and well-nourished. No distress.  HENT:  Head: Normocephalic.  Eyes: Pupils are equal, round, and reactive to light.  Neck: Normal range of motion. Neck supple.  Cardiovascular: Normal rate.   Respiratory: Effort normal.  GI: Soft. She exhibits no distension. There is no tenderness. There is no rebound.       Gravid 17 weeks  Genitourinary:       Small amount of opaque pink discharge in vault; cervix parous, closed non tender; small amount of active bleeding.  No appreciable tenderness or adnexal enlargement  Musculoskeletal: Normal range of motion.  Neurological: She is alert and oriented to person, place, and time.  Skin: Skin is warm and  dry.  Psychiatric: She has a normal mood and affect.    MAU Course  Procedures   Results for orders placed during the hospital encounter of 03/24/12 (from the past 24 hour(s))  CBC     Status: Abnormal   Collection Time   03/24/12  3:50 PM      Component Value Range   WBC 8.4  4.0 - 10.5 K/uL   RBC 3.80 (*) 3.87 - 5.11 MIL/uL   Hemoglobin 11.5 (*) 12.0 - 15.0 g/dL   HCT 40.9 (*) 81.1 - 91.4 %   MCV 89.2  78.0 - 100.0 fL   MCH 30.3  26.0 - 34.0 pg   MCHC 33.9  30.0 - 36.0 g/dL   RDW 78.2  95.6 - 21.3 %   Platelets 313  150 - 400 K/uL  HCG, QUANTITATIVE, PREGNANCY     Status: Abnormal   Collection Time   03/24/12  3:50 PM      Component Value Range   hCG, Beta Chain, Quant, S 13975 (*) <5 mIU/mL   Results for orders placed during the hospital encounter of 03/24/12 (from the past 24 hour(s))  URINALYSIS, ROUTINE W REFLEX MICROSCOPIC     Status: Abnormal   Collection Time   03/24/12  1:51 PM      Component Value Range   Color, Urine YELLOW  YELLOW   APPearance HAZY (*) CLEAR   Specific Gravity,  Urine 1.020  1.005 - 1.030   pH 7.0  5.0 - 8.0   Glucose, UA NEGATIVE  NEGATIVE mg/dL   Hgb urine dipstick NEGATIVE  NEGATIVE   Bilirubin Urine NEGATIVE  NEGATIVE   Ketones, ur NEGATIVE  NEGATIVE mg/dL   Protein, ur NEGATIVE  NEGATIVE mg/dL   Urobilinogen, UA 0.2  0.0 - 1.0 mg/dL   Nitrite NEGATIVE  NEGATIVE   Leukocytes, UA NEGATIVE  NEGATIVE  CBC     Status: Abnormal   Collection Time   03/24/12  3:50 PM      Component Value Range   WBC 8.4  4.0 - 10.5 K/uL   RBC 3.80 (*) 3.87 - 5.11 MIL/uL   Hemoglobin 11.5 (*) 12.0 - 15.0 g/dL   HCT 16.1 (*) 09.6 - 04.5 %   MCV 89.2  78.0 - 100.0 fL   MCH 30.3  26.0 - 34.0 pg   MCHC 33.9  30.0 - 36.0 g/dL   RDW 40.9  81.1 - 91.4 %   Platelets 313  150 - 400 K/uL  HCG, QUANTITATIVE, PREGNANCY     Status: Abnormal   Collection Time   03/24/12  3:50 PM      Component Value Range   hCG, Beta Chain, Quant, S 13975 (*) <5 mIU/mL  WET PREP,  GENITAL     Status: Abnormal   Collection Time   03/24/12  5:07 PM      Component Value Range   Yeast Wet Prep HPF POC NONE SEEN  NONE SEEN   Trich, Wet Prep NONE SEEN  NONE SEEN   Clue Cells Wet Prep HPF POC MODERATE (*) NONE SEEN   WBC, Wet Prep HPF POC MANY (*) NONE SEEN   Assessment and Plan  abd pain in pregnancy Viable IUP 17 weeks bv-flagyl 500mg  BID for 7 days GC/Chlamydia -pending F/u for pregnant care  Evolett Somarriba 03/24/2012, 5:12 PM

## 2012-03-25 NOTE — MAU Provider Note (Signed)
Attestation of Attending Supervision of Advanced Practitioner: Evaluation and management procedures were performed by the PA/NP/CNM/OB Fellow under my supervision/collaboration. Chart reviewed and agree with management and plan.  Khylee Algeo V 03/25/2012 5:49 AM

## 2012-04-16 ENCOUNTER — Inpatient Hospital Stay (HOSPITAL_COMMUNITY)
Admission: AD | Admit: 2012-04-16 | Discharge: 2012-04-16 | Disposition: A | Discharge status: Home / Self Care | Payer: Medicaid Other | Source: Ambulatory Visit | Attending: Family Medicine | Admitting: Family Medicine

## 2012-04-16 ENCOUNTER — Encounter (HOSPITAL_COMMUNITY): Payer: Self-pay | Admitting: *Deleted

## 2012-04-16 DIAGNOSIS — W010XXA Fall on same level from slipping, tripping and stumbling without subsequent striking against object, initial encounter: Secondary | ICD-10-CM | POA: Insufficient documentation

## 2012-04-16 DIAGNOSIS — M549 Dorsalgia, unspecified: Secondary | ICD-10-CM | POA: Insufficient documentation

## 2012-04-16 DIAGNOSIS — Y93K1 Activity, walking an animal: Secondary | ICD-10-CM | POA: Insufficient documentation

## 2012-04-16 DIAGNOSIS — R109 Unspecified abdominal pain: Secondary | ICD-10-CM | POA: Insufficient documentation

## 2012-04-16 DIAGNOSIS — O99891 Other specified diseases and conditions complicating pregnancy: Secondary | ICD-10-CM | POA: Insufficient documentation

## 2012-04-16 DIAGNOSIS — Z3490 Encounter for supervision of normal pregnancy, unspecified, unspecified trimester: Secondary | ICD-10-CM

## 2012-04-16 NOTE — MAU Provider Note (Signed)
History     CSN: [id number]  Arrival date and time: 04/16/12 1304   None     Chief Complaint  Patient presents with  . Abdominal Pain  . Back Pain   HPI Patient is a 31 y.o., [id number], [redacted]w[redacted]d AA female who presents to the MAU today c/o lower abdominal pain and increased vaginal spotting after a fall she experienced yesterday morning.  Patient states she was walking her dog when she got entagled in the leash and fell on backwards onto her right buttocks/hip.  Patient denies any major trauma, trauma to the head, trauma to the abdomen/uterus or LOC.  Patient endorses that since that time she has experienced pain in the lower abdomen and pelvic region bilaterally that she describes as constant dull ache with intermittent sharp pains and a 6/10 on the pain scale at its worst.  She has tried taking Tylenol with mild relief of her symptoms.  She denies that pain changes with position but endorses that the pain seems worse after urinating.  Patient also c/o of a slight increase in vaginal spotting since the fall.  She does note that she has had spotting everyday since her threatened abortion 1 month ago.  She does not spot enough to "fill a tampon"  No other discharge noted from vagina.  Patient expresses concern because she can't feel her baby moving as much but notes it could be due to anxiety.    OB History    Grav Para Term Preterm Abortions TAB SAB Ect Mult Living   5 2 2  2 1 1   2     HIstory of preeclampsia with first pregnancy  Past Medical History  Diagnosis Date  . Bartholin cyst     Past Surgical History  Procedure Date  . Wisdom tooth extraction     History reviewed. No pertinent family history.  History  Substance Use Topics  . Smoking status: Former Smoker -- 0.2 packs/day    Quit date: 01/23/2012  . Smokeless tobacco: Not on file  . Alcohol Use: No    Allergies: No Known Allergies  Prescriptions prior to admission  Medication Sig Dispense Refill  . acetaminophen  (TYLENOL) 500 MG tablet Take 500 mg by mouth every 6 (six) hours as needed. For pain        Review of Systems  Constitutional: Negative.   HENT: Negative for neck pain.   Respiratory: Negative.   Cardiovascular: Negative.   Gastrointestinal: Positive for abdominal pain. Negative for heartburn, nausea, vomiting, diarrhea, constipation, blood in stool and melena.  Genitourinary: Negative.   Musculoskeletal: Positive for myalgias and falls. Negative for back pain and joint pain.   Physical Exam   Blood pressure 114/89, pulse 78, temperature 98.6 F (37 C), temperature source Oral, resp. rate 18, height 5' 4.5" (1.638 m), weight 91.627 kg (202 lb), last menstrual period 01/23/2012, SpO2 100.00%, unknown if currently breastfeeding.  Physical Exam General - WD, WN AA female in NAD sitting comfortably on examination table Heart - RRR; S1/S2 distinct without murmur; no S3/S4; no rubs, clicks or gallops Lungs - CTAB; no use of accessory muscles noted  Abdomen - BS +; Slight LRQ/LLQ abdominal pain with deep palpation w/o guarding or rebound tenderness Pelvic - L Bartholin gland cyst; thin, white discharge noted at the vaginal opening;  Speculum exam reveals multiparous closed os; no evidence of bleeding at the cervix or in the vaginal vault. Cervix visually long and closed.  Peripheral Vascular - no edema; peripheral pulses  2+ and equal bilaterally  MAU Course  Procedures  MDM --Obtained a Wet Prep due to abdominal pain, but due to further history taking, do not think necessary to wait on results especially given no discharge or symptoms other than abdominal pain which has better alternate reason.   Assessment and Plan  Robin Peters 31 y.o. female  [phone] at [redacted]w[redacted]d presenting with pain likely 2/2 fall and trauma to the round ligaments.   -- Tylenol as needed for pain.   -- Patient instructed to support abdomen with pillows when sleeping to help reduce tension on the ligaments  Fetal Heart  Rate was noted with monitor.  This was expressed to the patient.  Of Note -- Patient has not received prenatal care to this point.  Was unaware of pregnancy until threatened abortion 1 month ago.  Has medicaid now and appointment on Sept 23, 2013 with [facility].  Marcelline Mates 04/16/2012, 3:25 PM   Family Medicine Upper Level Addendum:   I have seen and examined the patient independently, discussed with Marcelline Mates, PA-S2, fully reviewed the MAU note and agree with it's contents as updated above. My independent exam is below.   BP 114/89  Pulse 78  Temp 98.6 F (37 C) (Oral)  Resp 18  Ht 5' 4.5" (1.638 m)  Wt 91.627 kg (202 lb)  BMI 34.14 kg/m2  SpO2 100%  LMP 01/23/2012  Breastfeeding? Unknown Gen: NAD, resting comfortably in bed HEENT: NCAT, MMM, PERRLA  CV: RRR no mrg  Lungs: CTAB  Abd: soft/nontendernormal bowel sounds. Minimal tenderness to deep palpation of lower abdomen bilaterally. Gravid. Size consistent with dates.  MSK: moves all extremities, no edema  Skin: warm and dry, no rash  Pelvic exam-performed under my supervision and I independently viewed multiparous visually closed and long cervix with no evidence of active bleeding or dried blood in vaginal vault.    Tana Conch, MD, PGY2 04/16/2012 3:58 PM

## 2012-04-16 NOTE — MAU Note (Signed)
Patient states she has had no prenatal care pending Medicaid. Fell yesterday and now having abdominal and back pain. Has felt movement but none since the fall. No bleeding or leaking.

## 2012-04-16 NOTE — MAU Provider Note (Signed)
I have seen and examined patient and agree with above. FHT by doppler 140. Planning to f/u with [facility]. Pt instructed to call them or return to MAU if symptoms worsen. Napoleon Form, MD 04/16/2012 6:08 PM

## 2012-04-27 LAB — OB RESULTS CONSOLE RUBELLA ANTIBODY, IGM: Rubella: IMMUNE

## 2012-04-27 LAB — OB RESULTS CONSOLE HEPATITIS B SURFACE ANTIGEN: Hepatitis B Surface Ag: NEGATIVE

## 2012-05-28 LAB — OB RESULTS CONSOLE HIV ANTIBODY (ROUTINE TESTING): HIV: NONREACTIVE

## 2012-06-01 ENCOUNTER — Encounter: Payer: Self-pay | Admitting: Cardiology

## 2012-06-01 ENCOUNTER — Encounter: Payer: Self-pay | Admitting: *Deleted

## 2012-06-01 DIAGNOSIS — R109 Unspecified abdominal pain: Secondary | ICD-10-CM | POA: Insufficient documentation

## 2012-06-01 DIAGNOSIS — N907 Vulvar cyst: Secondary | ICD-10-CM | POA: Insufficient documentation

## 2012-06-01 DIAGNOSIS — M549 Dorsalgia, unspecified: Secondary | ICD-10-CM | POA: Insufficient documentation

## 2012-06-01 DIAGNOSIS — A599 Trichomoniasis, unspecified: Secondary | ICD-10-CM | POA: Insufficient documentation

## 2012-06-01 DIAGNOSIS — N750 Cyst of Bartholin's gland: Secondary | ICD-10-CM | POA: Insufficient documentation

## 2012-06-02 ENCOUNTER — Encounter: Payer: Self-pay | Admitting: Cardiology

## 2012-06-02 ENCOUNTER — Ambulatory Visit (INDEPENDENT_AMBULATORY_CARE_PROVIDER_SITE_OTHER): Payer: Medicaid Other | Admitting: Cardiology

## 2012-06-02 VITALS — BP 110/80 | HR 85 | Ht 67.0 in | Wt 210.0 lb

## 2012-06-02 DIAGNOSIS — R55 Syncope and collapse: Secondary | ICD-10-CM

## 2012-06-02 DIAGNOSIS — R002 Palpitations: Secondary | ICD-10-CM

## 2012-06-02 NOTE — Patient Instructions (Addendum)
Your physician recommends that you schedule a follow-up appointment in: 6 WEEKS WITH DR CRENSHAW IN [place]  Your physician has requested that you have an echocardiogram. Echocardiography is a painless test that uses sound waves to create images of your heart. It provides your doctor with information about the size and shape of your heart and how well your heart's chambers and valves are working. This procedure takes approximately one hour. There are no restrictions for this procedure. AT [facility] OFFICE  Your physician has recommended that you wear an event monitor. Event monitors are medical devices that record the heart's electrical activity. Doctors most often [place] these monitors to diagnose arrhythmias. Arrhythmias are problems with the speed or rhythm of the heartbeat. The monitor is a small, portable device. You can wear one while you do your normal daily activities. This is usually used to diagnose what is causing palpitations/syncope (passing out).AT [facility] OFFICE

## 2012-06-02 NOTE — Assessment & Plan Note (Signed)
Each episode occurred with palpitations. Plan CardioNet. Patient instructed not to drive. Followup in 6 weeks.

## 2012-06-02 NOTE — Progress Notes (Signed)
  HPI: 31 year old female for evaluation of palpitations. Patient is [redacted] weeks pregnant. She states with her first pregnancy 15 years ago she had episodes of palpitations associated with syncope. She had a workup in [facility] but those records are not available. When she is not pregnant she denies dyspnea, chest pain, palpitations or syncope. With this pregnancy she has again developed palpitations. They are sudden in onset and not associated with activity. They are described as her heart racing. She developed chest tightness and shortness of breath as well as dizziness. She has had 3 episodes of syncope associated with her palpitations. She does state that if she sits or lays down she will not pass out. Each episode of syncope occurred approximately 5 minutes after the onset of her palpitations. She otherwise has not had dyspnea or chest pain. Because of the above we were asked to evaluate. Note her palpitations typically last approximately one hour.  Current Outpatient Prescriptions  Medication Sig Dispense Refill  . acetaminophen (TYLENOL) 500 MG tablet Take 500-1,000 mg by mouth every 6 (six) hours as needed. For pain      . Prenatal Vit-Fe Fumarate-FA (PRENATAL MULTIVITAMIN) TABS Take 1 tablet by mouth at bedtime.        No Known Allergies  Past Medical History  Diagnosis Date  . Bartholin cyst   . Trichimoniasis   . Labial cyst   . Pregnancy induced hypertension     Past Surgical History  Procedure Date  . Wisdom tooth extraction     History   Social History  . Marital Status: Single    Spouse Name: N/A    Number of Children: 2  . 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 Smoking status: Former Smoker -- 0.2 packs/day    Quit date: 01/23/2012  . Smokeless tobacco: Not on file  . Alcohol Use: No  . Drug Use: No  . Sexually Active: Yes    Birth Control/ Protection: None   Other Topics Concern  . Not  on file   Social History Narrative  . No narrative on file    Family History  Problem Relation Age of Onset  . Heart disease      Adopted and unknown family history    ROS: no fevers or chills, productive cough, hemoptysis, dysphasia, odynophagia, melena, hematochezia, dysuria, hematuria, rash, seizure activity, orthopnea, PND, pedal edema, claudication. Remaining systems are negative.  Physical Exam:   Blood pressure 110/80, pulse 85, height 5\' 7"  (1.702 m), weight 210 lb (95.255 kg), last menstrual period 01/23/2012.  General:  Well developed/well nourished in NAD Skin warm/dry; tatoos Patient not depressed No peripheral clubbing Back-normal HEENT-normal/normal eyelids Neck supple/normal carotid upstroke bilaterally; no bruits; no JVD; no thyromegaly chest - CTA/ normal expansion CV - RRR/normal S1 and S2; no murmurs, rubs or gallops;  PMI nondisplaced Abdomen -NT/ND, no HSM, + bowel sounds, no bruit, 26 week intrauterine pregnancy 2+ femoral pulses, no bruits Ext-no edema, chords, 2+ DP, previous trauma to right foot and ankle Neuro-grossly nonfocal  ECG Sinus rhythm, short PR interval, nonspecific ST changes

## 2012-06-02 NOTE — Assessment & Plan Note (Signed)
Etiology unclear. Patient noted to have a short PR interval. Plan CardioNet to further assess. Echocardiogram to evaluate LV function. Further recommendations based on results.

## 2012-06-08 ENCOUNTER — Ambulatory Visit (HOSPITAL_COMMUNITY): Payer: Medicaid Other | Attending: Cardiology

## 2012-06-08 ENCOUNTER — Encounter (INDEPENDENT_AMBULATORY_CARE_PROVIDER_SITE_OTHER): Payer: Medicaid Other

## 2012-06-08 DIAGNOSIS — R0989 Other specified symptoms and signs involving the circulatory and respiratory systems: Secondary | ICD-10-CM | POA: Insufficient documentation

## 2012-06-08 DIAGNOSIS — R002 Palpitations: Secondary | ICD-10-CM | POA: Insufficient documentation

## 2012-06-08 DIAGNOSIS — R55 Syncope and collapse: Secondary | ICD-10-CM | POA: Insufficient documentation

## 2012-06-08 DIAGNOSIS — Z87891 Personal history of nicotine dependence: Secondary | ICD-10-CM | POA: Insufficient documentation

## 2012-06-08 DIAGNOSIS — Z331 Pregnant state, incidental: Secondary | ICD-10-CM | POA: Insufficient documentation

## 2012-06-08 DIAGNOSIS — R0609 Other forms of dyspnea: Secondary | ICD-10-CM | POA: Insufficient documentation

## 2012-06-08 NOTE — Progress Notes (Signed)
Echocardiogram performed.  

## 2012-06-09 ENCOUNTER — Telehealth: Payer: Self-pay | Admitting: *Deleted

## 2012-06-09 NOTE — Telephone Encounter (Signed)
Message copied by Tarri Fuller on Wed Jun 09, 2012 10:36 AM ------      Message from: Lewayne Bunting      Created: Tue Jun 08, 2012  7:06 PM       Hazle Coca

## 2012-06-09 NOTE — Telephone Encounter (Signed)
pt notified about echo results w/verbal understanding 

## 2012-06-10 DIAGNOSIS — R55 Syncope and collapse: Secondary | ICD-10-CM

## 2012-07-05 ENCOUNTER — Encounter (HOSPITAL_BASED_OUTPATIENT_CLINIC_OR_DEPARTMENT_OTHER): Payer: Self-pay | Admitting: *Deleted

## 2012-07-05 ENCOUNTER — Emergency Department (HOSPITAL_BASED_OUTPATIENT_CLINIC_OR_DEPARTMENT_OTHER)
Admission: EM | Admit: 2012-07-05 | Discharge: 2012-07-05 | Disposition: A | Discharge status: Home / Self Care | Payer: Medicaid Other | Attending: Emergency Medicine | Admitting: Emergency Medicine

## 2012-07-05 DIAGNOSIS — Z8742 Personal history of other diseases of the female genital tract: Secondary | ICD-10-CM | POA: Insufficient documentation

## 2012-07-05 DIAGNOSIS — O2340 Unspecified infection of urinary tract in pregnancy, unspecified trimester: Secondary | ICD-10-CM

## 2012-07-05 DIAGNOSIS — O139 Gestational [pregnancy-induced] hypertension without significant proteinuria, unspecified trimester: Secondary | ICD-10-CM | POA: Insufficient documentation

## 2012-07-05 DIAGNOSIS — Z8619 Personal history of other infectious and parasitic diseases: Secondary | ICD-10-CM | POA: Insufficient documentation

## 2012-07-05 DIAGNOSIS — I10 Essential (primary) hypertension: Secondary | ICD-10-CM | POA: Insufficient documentation

## 2012-07-05 DIAGNOSIS — Z87891 Personal history of nicotine dependence: Secondary | ICD-10-CM | POA: Insufficient documentation

## 2012-07-05 DIAGNOSIS — O2390 Unspecified genitourinary tract infection in pregnancy, unspecified trimester: Secondary | ICD-10-CM | POA: Insufficient documentation

## 2012-07-05 DIAGNOSIS — N390 Urinary tract infection, site not specified: Secondary | ICD-10-CM | POA: Insufficient documentation

## 2012-07-05 LAB — URINALYSIS, ROUTINE W REFLEX MICROSCOPIC
Bilirubin Urine: NEGATIVE
Nitrite: POSITIVE — AB
Specific Gravity, Urine: 1.02 (ref 1.005–1.030)
Urobilinogen, UA: 1 mg/dL (ref 0.0–1.0)
pH: 6.5 (ref 5.0–8.0)

## 2012-07-05 LAB — URINE MICROSCOPIC-ADD ON

## 2012-07-05 MED ORDER — CEPHALEXIN 250 MG PO CAPS
500.0000 mg | ORAL_CAPSULE | Freq: Once | ORAL | Status: AC
  Administered 2012-07-05: 500 mg via ORAL
  Filled 2012-07-05: qty 2

## 2012-07-05 MED ORDER — CEPHALEXIN 500 MG PO CAPS: 500.0000 mg | ORAL_CAPSULE | Freq: Four times a day (QID) | ORAL | Status: DC

## 2012-07-05 NOTE — ED Notes (Signed)
Received call from [facility] RN. RN states fetal HR is normal and no contractions noted. Pt to remain here for monitoring x 1 hour per OB MD on call.

## 2012-07-05 NOTE — ED Notes (Signed)
Pt is 32 weeks preg  With lower abd pain x 1 day

## 2012-07-05 NOTE — ED Notes (Signed)
In with MD to chaperone for bimanual exam.

## 2012-07-05 NOTE — ED Notes (Signed)
Pt c/o lower abd cramping while walking around in store tonight. Pt denies any vaginal bleeding. Pt is on fetal monitor and rapid response OB RN contacted. Fetal HR 149 on monitor.

## 2012-07-05 NOTE — Progress Notes (Signed)
Monitoring requested at 2005 for this patient.  She is a G5P2, 31wk 5da, c/o ongoing abdominal pain today.  FHT 150 when Obix started.

## 2012-07-05 NOTE — ED Provider Notes (Signed)
History  This chart was scribed for Robin Peters B. Bernette Mayers, MD by Ardeen Jourdain, ED Scribe. This patient was seen in room MHT14/MHT14 and the patient's care was started at 2002.  CSN: [id number]  Arrival date & time 07/05/12  1940   First MD Initiated Contact with Patient 07/05/12 2002      Chief Complaint  Patient presents with  . Abdominal Pain     The history is provided by the patient. No language interpreter was used.    Robin Peters is a 31 y.o. female who presents to the Emergency Department complaining of lower abdominal pain that started 1 day ago with associated pressure. She denies vaginal discharge, vaginal bleeding, urinary incontinence, bowel incontinence, fever, nausea, emesis and diarrhea. She is currently [redacted] weeks pregnant. She states the pain is similar to contractions.She has a h/o bartholin cyst, trichomoniasis, labial cyst and pregnancy induced HTN. She is a former smoker but denies alcohol use.   Past Medical History  Diagnosis Date  . Bartholin cyst   . Trichimoniasis   . Labial cyst   . Pregnancy induced hypertension     Past Surgical History  Procedure Date  . Wisdom tooth extraction     Family History  Problem Relation Age of Onset  . Heart disease      Adopted and unknown family history    History  Substance Use Topics  . Smoking status: Former Smoker -- 0.2 packs/day    Quit date: 01/23/2012  . Smokeless tobacco: Not on file  . Alcohol Use: No    OB History    Grav Para Term Preterm Abortions TAB SAB Ect Mult Living   5 2 2  2 1 1   2       Review of Systems  All other systems reviewed and are negative.  A complete 10 system review of systems was obtained and all systems are negative except as noted in the HPI and PMH.    Allergies  Review of patient's allergies indicates no known allergies.  Home Medications   Current Outpatient Rx  Name  Route  Sig  Dispense  Refill  . ACETAMINOPHEN 500 MG PO TABS   Oral   Take 500-1,000  mg by mouth every 6 (six) hours as needed. For pain         . PRENATAL MULTIVITAMIN CH   Oral   Take 1 tablet by mouth at bedtime.           Triage Vitals: BP 142/87  Pulse 95  Temp 98.7 F (37.1 C) (Oral)  Resp 16  Ht 5\' 7"  (1.702 m)  Wt 210 lb (95.255 kg)  BMI 32.89 kg/m2  SpO2 100%  LMP 11/28/2011  Physical Exam  Nursing note and vitals reviewed. Constitutional: She is oriented to person, place, and time. She appears well-developed and well-nourished.  HENT:  Head: Normocephalic and atraumatic.  Eyes: EOM are normal. Pupils are equal, round, and reactive to light.  Neck: Normal range of motion. Neck supple.  Cardiovascular: Normal rate, normal heart sounds and intact distal pulses.   Pulmonary/Chest: Effort normal and breath sounds normal.  Abdominal: Bowel sounds are normal. She exhibits no distension. There is no tenderness.  Genitourinary:       Gravid, cervix is fingertip long and high  Musculoskeletal: Normal range of motion. She exhibits no edema and no tenderness.  Neurological: She is alert and oriented to person, place, and time. She has normal strength. No cranial nerve deficit or sensory  deficit.  Skin: Skin is warm and dry. No rash noted.  Psychiatric: She has a normal mood and affect.    ED Course  Procedures (including critical care time)  DIAGNOSTIC STUDIES: Oxygen Saturation is 100% on room air, normal by my interpretation.    COORDINATION OF CARE:  8:04 PM: Discussed treatment plan which includes a urinalysis with pt at bedside and pt agreed to plan.    Labs Reviewed  URINALYSIS, ROUTINE W REFLEX MICROSCOPIC - Abnormal; Notable for the following:    APPearance CLOUDY (*)     Ketones, ur 15 (*)     Protein, ur 30 (*)     Nitrite POSITIVE (*)     Leukocytes, UA LARGE (*)     All other components within normal limits  URINE MICROSCOPIC-ADD ON - Abnormal; Notable for the following:    Squamous Epithelial / LPF FEW (*)     Bacteria, UA  MANY (*)     All other components within normal limits  URINE CULTURE   No results found.   No diagnosis found.    MDM  Discussed with Dr. Clearance Coots who is the patient's Ob. He agrees with plan for brief ED observation on monitor, treatment for UTI and close outpatient followup. No signs of pyelonephritis or sepsis. Cervix is closed. Doubt this is labor. No concerning findings on fetal monitor per OB nurse from MAU. Pt ready to go home.       I personally performed the services described in this documentation, which was scribed in my presence. The recorded information has been reviewed and is accurate.     Laurel Smeltz B. Bernette Mayers, MD 07/05/12 2154

## 2012-07-05 NOTE — ED Notes (Signed)
MD at bedside. 

## 2012-07-07 LAB — URINE CULTURE: Colony Count: >=100000

## 2012-07-08 NOTE — ED Notes (Signed)
+   Urine Patient treated with keflex-sensitive to same-chart appended per protocol MD. 

## 2012-07-21 ENCOUNTER — Telehealth: Payer: Self-pay | Admitting: *Deleted

## 2012-07-21 NOTE — Telephone Encounter (Signed)
Unable to reach pt or leave a message, need to let pt know the monitor reviewed by dr Jens Som shows sinus.

## 2012-07-23 ENCOUNTER — Encounter: Payer: Self-pay | Admitting: *Deleted

## 2012-07-23 NOTE — Telephone Encounter (Signed)
Letter of results sent to pt  

## 2012-07-28 ENCOUNTER — Ambulatory Visit: Payer: Medicaid Other | Admitting: Cardiology

## 2012-08-18 NOTE — L&D Delivery Note (Signed)
Delivery Note At 10:44 AM a viable female was delivered via Vaginal, Spontaneous Delivery (Presentation: ; Occiput Anterior).  APGAR: 9 - 9, ; weight .   Placenta status: Intact , .  Cord: 3 vessels with the following complications: None.  Cord pH: none  Anesthesia: None  Episiotomy: None Lacerations: None Suture Repair: none Est. Blood Loss (mL): 350  Mom to postpartum.  Baby to nursery-stable.  HARPER,CHARLES A 08/26/2012, 10:58 AM

## 2012-08-24 ENCOUNTER — Encounter (HOSPITAL_COMMUNITY): Payer: Self-pay

## 2012-08-24 ENCOUNTER — Inpatient Hospital Stay (HOSPITAL_COMMUNITY): Payer: Medicaid Other

## 2012-08-24 ENCOUNTER — Inpatient Hospital Stay (HOSPITAL_COMMUNITY)
Admission: AD | Admit: 2012-08-24 | Discharge: 2012-08-28 | DRG: 768 | Disposition: A | Discharge status: Home / Self Care | Payer: Medicaid Other | Source: Ambulatory Visit | Attending: Obstetrics | Admitting: Obstetrics

## 2012-08-24 DIAGNOSIS — N750 Cyst of Bartholin's gland: Secondary | ICD-10-CM | POA: Diagnosis present

## 2012-08-24 DIAGNOSIS — O479 False labor, unspecified: Secondary | ICD-10-CM

## 2012-08-24 DIAGNOSIS — O2390 Unspecified genitourinary tract infection in pregnancy, unspecified trimester: Secondary | ICD-10-CM | POA: Diagnosis present

## 2012-08-24 HISTORY — DX: Supraventricular tachycardia, unspecified: I47.10

## 2012-08-24 HISTORY — DX: Supraventricular tachycardia: I47.1

## 2012-08-24 LAB — CBC
Hemoglobin: 10.8 g/dL — ABNORMAL LOW (ref 12.0–15.0)
MCH: 28.2 pg (ref 26.0–34.0)
MCV: 85.9 fL (ref 78.0–100.0)
Platelets: 216 10*3/uL (ref 150–400)
RBC: 3.83 MIL/uL — ABNORMAL LOW (ref 3.87–5.11)
WBC: 6.4 10*3/uL (ref 4.0–10.5)

## 2012-08-24 MED ORDER — LACTATED RINGERS IV SOLN
INTRAVENOUS | Status: DC
  Administered 2012-08-24: 22:00:00 via INTRAVENOUS

## 2012-08-24 MED ORDER — OXYTOCIN 40 UNITS IN LACTATED RINGERS INFUSION - SIMPLE MED: 62.5000 mL/h | INTRAVENOUS | Status: DC

## 2012-08-24 MED ORDER — OXYCODONE-ACETAMINOPHEN 5-325 MG PO TABS
1.0000 | ORAL_TABLET | Freq: Once | ORAL | Status: AC
  Administered 2012-08-24: 1 via ORAL
  Filled 2012-08-24: qty 1

## 2012-08-24 MED ORDER — FLEET ENEMA 7-19 GM/118ML RE ENEM: 1.0000 | ENEMA | RECTAL | Status: DC | PRN

## 2012-08-24 MED ORDER — OXYCODONE-ACETAMINOPHEN 5-325 MG PO TABS
1.0000 | ORAL_TABLET | ORAL | Status: DC | PRN
  Administered 2012-08-25: 2 via ORAL
  Filled 2012-08-24: qty 2

## 2012-08-24 MED ORDER — IBUPROFEN 600 MG PO TABS: 600.0000 mg | ORAL_TABLET | Freq: Four times a day (QID) | ORAL | Status: DC | PRN

## 2012-08-24 MED ORDER — LACTATED RINGERS IV SOLN: 500.0000 mL | INTRAVENOUS | Status: DC | PRN

## 2012-08-24 MED ORDER — LIDOCAINE HCL (PF) 1 % IJ SOLN: 30.0000 mL | INTRAMUSCULAR | Status: DC | PRN

## 2012-08-24 MED ORDER — OXYTOCIN BOLUS FROM INFUSION: 500.0000 mL | INTRAVENOUS | Status: DC

## 2012-08-24 MED ORDER — LIDOCAINE HCL 2 % EX GEL
Freq: Once | CUTANEOUS | Status: AC
  Administered 2012-08-24: 5 via TOPICAL
  Filled 2012-08-24: qty 20

## 2012-08-24 MED ORDER — ACETAMINOPHEN 325 MG PO TABS
650.0000 mg | ORAL_TABLET | ORAL | Status: DC | PRN
  Administered 2012-08-25: 650 mg via ORAL
  Filled 2012-08-24: qty 2

## 2012-08-24 MED ORDER — CITRIC ACID-SODIUM CITRATE 334-500 MG/5ML PO SOLN: 30.0000 mL | ORAL | Status: DC | PRN

## 2012-08-24 MED ORDER — ONDANSETRON HCL 4 MG/2ML IJ SOLN: 4.0000 mg | Freq: Four times a day (QID) | INTRAMUSCULAR | Status: DC | PRN

## 2012-08-24 MED ORDER — LABETALOL HCL 100 MG PO TABS: 200.0000 mg | ORAL_TABLET | Freq: Three times a day (TID) | ORAL | Status: DC

## 2012-08-24 NOTE — MAU Note (Addendum)
Dr. Clearance Coots notified of pt's reassuring efm tracing, noted few variables, baseline FHR 155-160, order for BPP.

## 2012-08-24 NOTE — MAU Note (Signed)
Pt off moniotr to BR-states she has a headache but is OK to ambulate to the  BR w/o assistence

## 2012-08-24 NOTE — MAU Note (Signed)
Pt is back to bed with SR up and callbelll in reach-states her head does not hurt any worse since the fall-it hurts just the same as it did-denies feeling dizzy at present-states her chest is hurting a little in the middle

## 2012-08-24 NOTE — MAU Note (Signed)
Pt states was seen at MD office today, bp elevated last few weeks. When standing pt feels severe vaginal pressure with standing, no urge to push. Was 3cm in office today.

## 2012-08-24 NOTE — MAU Provider Note (Signed)
History     CSN: [id number]  Arrival date and time: 08/24/12 1601   First Provider Initiated Contact with Patient 08/24/12 1630      Chief Complaint  Patient presents with  . Labor Eval   HPI Robin Peters is a 32 y.o. female @ [redacted]w[redacted]d who presents to MAU with vaginal pain. The pain started a couple weeks ago. The pain is located on the left side of the vagina. There is swelling. Examined in the office and told she had a Bartholin's abscess and to take sitz baths and was given Augmentin. She come to MAU today with increased pain and swelling. The history was provided by the patient.  OB History    Grav Para Term Preterm Abortions TAB SAB Ect Mult Living   5 2 2  2 1 1   2       Past Medical History  Diagnosis Date  . Bartholin cyst   . Trichimoniasis   . Labial cyst   . Pregnancy induced hypertension   . SVT (supraventricular tachycardia)     Past Surgical History  Procedure Date  . Wisdom tooth extraction   . Dilation and curettage of uterus     Family History  Problem Relation Age of Onset  . Heart disease      Adopted and unknown family history    History  Substance Use Topics  . Smoking status: Former Smoker -- 0.2 packs/day    Quit date: 01/23/2012  . Smokeless tobacco: Never Used  . Alcohol Use: No    Allergies: No Known Allergies  Prescriptions prior to admission  Medication Sig Dispense Refill  . acetaminophen (TYLENOL) 500 MG tablet Take 500-1,000 mg by mouth every 6 (six) hours as needed. For pain      . cephALEXin (KEFLEX) 500 MG capsule Take 1 capsule (500 mg total) by mouth 4 (four) times daily.  28 capsule  0  . Prenatal Vit-Fe Fumarate-FA (PRENATAL MULTIVITAMIN) TABS Take 1 tablet by mouth at bedtime.        Review of Systems  Constitutional: Negative for fever and chills.  Eyes: Negative for blurred vision and double vision.  Respiratory: Negative for cough and wheezing.   Cardiovascular: Negative for chest pain.       Hx SVT    Gastrointestinal: Negative for nausea, vomiting and abdominal pain.  Genitourinary: Negative for dysuria and urgency.       Vaginal pain  Musculoskeletal: Positive for back pain.  Skin:       Vaginal swelling and tenderness  Neurological: Negative for dizziness, seizures and headaches.  Psychiatric/Behavioral: Negative for depression. The patient is not nervous/anxious.    Physical Exam   Blood pressure 134/84, pulse 87, temperature 97.3 F (36.3 C), temperature source Oral, resp. rate 18, height 5\' 7"  (1.702 m), weight 97.523 kg (215 lb), last menstrual period 11/28/2011.  Physical Exam  Nursing note and vitals reviewed. Constitutional: She is oriented to person, place, and time. She appears well-developed and well-nourished. No distress.       Appears uncomfortable  HENT:  Head: Normocephalic and atraumatic.  Eyes: EOM are normal.  Neck: Neck supple.  Cardiovascular: Normal rate.   Respiratory: Effort normal.  GI: Soft. There is no tenderness.       Gravid consistent with dates  Genitourinary:       External genitalia with edema and tenderness left labia and Bartholin's gland.  Dilation: 2 Effacement (%): 50 Cervical Position: Posterior Station: -3 Presentation: Vertex Exam by::  SBeck, RN  Musculoskeletal: Normal range of motion.  Neurological: She is alert and oriented to person, place, and time.  Skin: Skin is warm and dry.  Psychiatric: She has a normal mood and affect. Her behavior is normal. Judgment and thought content normal.   Reactive monitor tracing  Assessment: 32 y.o. female @ [redacted]w[redacted]d gestation with Bartholin's abscess  Plan:  I&D   Discussed with Dr. Clearance Coots, f/u in office in one week  Procedures Consent form signed. Time out.   Patient positioned and draped with sterile towels.  Preoperative medication: Lidocaine gel applied to the area. Percocet 1 tablet po    Area cleaned with betadine Local infiltrate with lidocaine 1%. Amount 2  ccs  I&D Scalpel size: #11blade Incision type: Straight single Complexity: Complex Drained large amount of fluid Irrigated with NSS  Wound treatment: Word Catheter placed and inflated with 3 cc. Of NS Packing: none Patient tolerance: Tolerated procedure well.   Note: @ 1807  EFM, baseline change and variables noted. Dr. Clearance Coots notified and patient will go for BPP.  Robin Sangiovanni, RN, FNP, [facility] 08/24/2012, 4:34 PM

## 2012-08-25 ENCOUNTER — Inpatient Hospital Stay (HOSPITAL_COMMUNITY): Payer: Medicaid Other

## 2012-08-25 ENCOUNTER — Encounter (HOSPITAL_COMMUNITY): Payer: Self-pay | Admitting: *Deleted

## 2012-08-25 LAB — COMPREHENSIVE METABOLIC PANEL
AST: 10 U/L (ref 0–37)
BUN: 5 mg/dL — ABNORMAL LOW (ref 6–23)
CO2: 22 mEq/L (ref 19–32)
Calcium: 8.7 mg/dL (ref 8.4–10.5)
Chloride: 102 mEq/L (ref 96–112)
Creatinine, Ser: 0.54 mg/dL (ref 0.50–1.10)
GFR calc non Af Amer: >90 mL/min (ref >90)
Total Bilirubin: 0.3 mg/dL (ref 0.3–1.2)

## 2012-08-25 LAB — URIC ACID: Uric Acid, Serum: 4.5 mg/dL (ref 2.4–7.0)

## 2012-08-25 LAB — URINE MICROSCOPIC-ADD ON

## 2012-08-25 LAB — URINALYSIS, ROUTINE W REFLEX MICROSCOPIC
Bilirubin Urine: NEGATIVE
Ketones, ur: 15 mg/dL — AB
Nitrite: POSITIVE — AB
Specific Gravity, Urine: 1.025 (ref 1.005–1.030)
Urobilinogen, UA: 0.2 mg/dL (ref 0.0–1.0)

## 2012-08-25 LAB — LACTATE DEHYDROGENASE: LDH: 127 U/L (ref 94–250)

## 2012-08-25 LAB — CBC
Hemoglobin: 10.5 g/dL — ABNORMAL LOW (ref 12.0–15.0)
MCH: 28.1 pg (ref 26.0–34.0)
MCHC: 32.6 g/dL (ref 30.0–36.0)

## 2012-08-25 LAB — RPR: RPR Ser Ql: NONREACTIVE

## 2012-08-25 MED ORDER — SODIUM CHLORIDE 0.9 % IJ SOLN
3.0000 mL | INTRAMUSCULAR | Status: DC | PRN
  Administered 2012-08-25: 3 mL via INTRAVENOUS

## 2012-08-25 MED ORDER — ZOLPIDEM TARTRATE 5 MG PO TABS
5.0000 mg | ORAL_TABLET | Freq: Every evening | ORAL | Status: DC | PRN
  Filled 2012-08-25: qty 1

## 2012-08-25 MED ORDER — SODIUM CHLORIDE 0.9 % IV SOLN
3.0000 g | Freq: Four times a day (QID) | INTRAVENOUS | Status: DC
  Administered 2012-08-25: 3 g via INTRAVENOUS
  Filled 2012-08-25 (×2): qty 3

## 2012-08-25 MED ORDER — SODIUM CHLORIDE 0.9 % IV SOLN
INTRAVENOUS | Status: DC
Start: 2012-08-25 — End: 2012-08-26
  Administered 2012-08-25: 12:00:00 via INTRAVENOUS

## 2012-08-25 MED ORDER — LACTATED RINGERS IV SOLN
INTRAVENOUS | Status: DC
  Administered 2012-08-25: 04:00:00 via INTRAVENOUS

## 2012-08-25 MED ORDER — TERBUTALINE SULFATE 1 MG/ML IJ SOLN: 0.2500 mg | Freq: Once | INTRAMUSCULAR | Status: AC | PRN

## 2012-08-25 MED ORDER — MISOPROSTOL 25 MCG QUARTER TABLET
25.0000 ug | ORAL_TABLET | ORAL | Status: DC | PRN
  Administered 2012-08-25 (×4): 25 ug via VAGINAL
  Filled 2012-08-25 (×4): qty 0.25

## 2012-08-25 MED ORDER — CEFTRIAXONE SODIUM 1 G IJ SOLR
1.0000 g | Freq: Two times a day (BID) | INTRAMUSCULAR | Status: DC
  Administered 2012-08-25 – 2012-08-26 (×3): 1 g via INTRAVENOUS
  Filled 2012-08-25 (×3): qty 10

## 2012-08-25 NOTE — H&P (Signed)
Robin Peters is a 32 y.o. female presenting for left Bartholin cyst. Maternal Medical History:  Reason for admission: 68 y o  G5 P2022.  Presented with severe pain from left Bartholin cyst.  I&D done.  The patient had palpitations and a syncopal episode upon going to bathroom afterwards.  She was therefore admitted for observation.  Fetal activity: Perceived fetal activity is normal.   Last perceived fetal movement was within the past hour.    Prenatal complications: Infection.   Prenatal Complications - Diabetes: none.    OB History    Grav Para Term Preterm Abortions TAB SAB Ect Mult Living   5 2 2  2 1 1   2      Past Medical History  Diagnosis Date  . Bartholin cyst   . Trichimoniasis   . Labial cyst   . Pregnancy induced hypertension   . SVT (supraventricular tachycardia)    Past Surgical History  Procedure Date  . Wisdom tooth extraction   . Dilation and curettage of uterus    Family History: family history includes Heart disease in an unspecified family member. Social History:  reports that she quit smoking about 7 months ago. She has never used smokeless tobacco. She reports that she does not drink alcohol or use illicit drugs.   Prenatal Transfer Tool  Maternal Diabetes: No Genetic Screening: Normal Maternal Ultrasounds/Referrals: Normal Fetal Ultrasounds or other Referrals:  None Maternal Substance Abuse:  No Significant Maternal Medications:  Meds include: Other:   Augmentin Significant Maternal Lab Results:  Lab values include: Group B Strep negative Other Comments:  None  ROS  Dilation: 2 Effacement (%): 50 Station: -3 Exam by:: SBeck, RN Blood pressure 130/97, pulse 104, temperature 97.8 F (36.6 C), temperature source Oral, resp. rate 18, height 5\' 7"  (1.702 m), weight 215 lb (97.523 kg), last menstrual period 11/28/2011, SpO2 100.00%. Maternal Exam:  Abdomen: Patient reports no abdominal tenderness. Fetal presentation: vertex  Pelvis: adequate for  delivery.   Cervix: Cervix evaluated by digital exam.     Physical Exam  Nursing note and vitals reviewed. Constitutional: She appears well-developed and well-nourished.  HENT:  Head: Normocephalic and atraumatic.  Eyes: Conjunctivae normal are normal. Pupils are equal, round, and reactive to light.  Neck: Normal range of motion. Neck supple.  Cardiovascular: Normal rate and regular rhythm.   Respiratory: Effort normal.  GI: Soft.  Genitourinary:       Left Bartholin cyst, drained and Word catheter in place.  Minimal tenderness.  Musculoskeletal: Normal range of motion.  Neurological: She is alert.  Skin: Skin is warm and dry.  Psychiatric: She has a normal mood and affect. Her behavior is normal. Judgment and thought content normal.    Prenatal labs: ABO, Rh: --/--/A POS (06/29 1320) Antibody: Negative (06/29 0000) Rubella: Immune (09/10 0000) RPR: NON REACTIVE (01/07 2240)  HBsAg: Negative (09/10 0000)  HIV: Non-reactive (10/11 0000)  GBS: Negative (12/11 0000)   Assessment/Plan: 38.6 weeks.  S/P I&D left Bartholin cyst.  Admitted for observation.   HARPER,CHARLES A 08/25/2012, 6:24 AM

## 2012-08-25 NOTE — Progress Notes (Signed)
Robin Peters is a 32 y.o. [id number] at [redacted]w[redacted]d by LMP admitted for pain, chest palpitations and increased heart rate.  Subjective:   Objective: BP 132/76  Pulse 77  Temp 98.3 F (36.8 C) (Oral)  Resp 20  Ht 5\' 7"  (1.702 m)  Wt 215 lb (97.523 kg)  BMI 33.67 kg/m2  SpO2 100%  LMP 11/28/2011      FHT:  FHR: 150 bpm, variability: moderate,  accelerations:  Present,  decelerations:  Absent UC:   irregular SVE:   Dilation: 2 Effacement (%): 60 Station: -2 Exam by:: Clearence Cheek RN  Labs: Lab Results  Component Value Date   WBC 6.4 08/25/2012   HGB 10.5* 08/25/2012   HCT 32.2* 08/25/2012   MCV 86.1 08/25/2012   PLT 211 08/25/2012    Assessment / Plan: 39 weeks.  H/O nonreactive NST and BPP of 2/8 after I&D of Bartholin cyst.  C/O palpitations, HA and dizziness.  Fell and experienced head trauma.  CT of head negative.  Patient followed by [facility] for these symptoms which apparently occur only during late 3rd trimester.  She has had 2 NSVD's by IOL because of these symptoms.  Will proceed with IOL. Labor: Latent phase Preeclampsia:  n/a Fetal Wellbeing:  Category I Pain Control:  Labor support without medications I/D:  n/a Anticipated MOD:  NSVD  [name] A 08/25/2012, 1:45 PM

## 2012-08-26 ENCOUNTER — Encounter (HOSPITAL_COMMUNITY): Payer: Self-pay | Admitting: *Deleted

## 2012-08-26 MED ORDER — FENTANYL 2.5 MCG/ML BUPIVACAINE 1/10 % EPIDURAL INFUSION (WH - ANES): 14.0000 mL/h | INTRAMUSCULAR | Status: DC

## 2012-08-26 MED ORDER — OXYTOCIN 40 UNITS IN LACTATED RINGERS INFUSION - SIMPLE MED
1.0000 m[IU]/min | INTRAVENOUS | Status: DC
  Administered 2012-08-26: 2 m[IU]/min via INTRAVENOUS
  Filled 2012-08-26: qty 1000

## 2012-08-26 MED ORDER — OXYCODONE-ACETAMINOPHEN 5-325 MG PO TABS
1.0000 | ORAL_TABLET | ORAL | Status: DC | PRN
  Administered 2012-08-26 – 2012-08-28 (×7): 1 via ORAL
  Filled 2012-08-26 (×7): qty 1

## 2012-08-26 MED ORDER — DIPHENHYDRAMINE HCL 50 MG/ML IJ SOLN
12.5000 mg | INTRAMUSCULAR | Status: DC | PRN
  Administered 2012-08-26: 12.5 mg via INTRAVENOUS
  Filled 2012-08-26: qty 1

## 2012-08-26 MED ORDER — ONDANSETRON HCL 4 MG/2ML IJ SOLN: 4.0000 mg | INTRAMUSCULAR | Status: DC | PRN

## 2012-08-26 MED ORDER — WITCH HAZEL-GLYCERIN EX PADS: 1.0000 "application " | MEDICATED_PAD | CUTANEOUS | Status: DC | PRN

## 2012-08-26 MED ORDER — LACTATED RINGERS IV SOLN: 500.0000 mL | Freq: Once | INTRAVENOUS | Status: DC

## 2012-08-26 MED ORDER — DIPHENHYDRAMINE HCL 25 MG PO CAPS: 25.0000 mg | ORAL_CAPSULE | Freq: Four times a day (QID) | ORAL | Status: DC | PRN

## 2012-08-26 MED ORDER — PRENATAL MULTIVITAMIN CH
1.0000 | ORAL_TABLET | Freq: Every day | ORAL | Status: DC
  Administered 2012-08-26 – 2012-08-28 (×3): 1 via ORAL
  Filled 2012-08-26 (×3): qty 1

## 2012-08-26 MED ORDER — TETANUS-DIPHTH-ACELL PERTUSSIS 5-2.5-18.5 LF-MCG/0.5 IM SUSP
0.5000 mL | Freq: Once | INTRAMUSCULAR | Status: AC
  Administered 2012-08-28: 0.5 mL via INTRAMUSCULAR
  Filled 2012-08-26: qty 0.5

## 2012-08-26 MED ORDER — SIMETHICONE 80 MG PO CHEW: 80.0000 mg | CHEWABLE_TABLET | ORAL | Status: DC | PRN

## 2012-08-26 MED ORDER — ONDANSETRON HCL 4 MG PO TABS: 4.0000 mg | ORAL_TABLET | ORAL | Status: DC | PRN

## 2012-08-26 MED ORDER — IBUPROFEN 600 MG PO TABS
600.0000 mg | ORAL_TABLET | Freq: Four times a day (QID) | ORAL | Status: DC
  Administered 2012-08-26 – 2012-08-28 (×8): 600 mg via ORAL
  Filled 2012-08-26 (×8): qty 1

## 2012-08-26 MED ORDER — SENNOSIDES-DOCUSATE SODIUM 8.6-50 MG PO TABS
2.0000 | ORAL_TABLET | Freq: Every day | ORAL | Status: DC
  Administered 2012-08-26 – 2012-08-27 (×2): 2 via ORAL

## 2012-08-26 MED ORDER — PHENYLEPHRINE 40 MCG/ML (10ML) SYRINGE FOR IV PUSH (FOR BLOOD PRESSURE SUPPORT): 80.0000 ug | PREFILLED_SYRINGE | INTRAVENOUS | Status: DC | PRN

## 2012-08-26 MED ORDER — EPHEDRINE 5 MG/ML INJ: 10.0000 mg | INTRAVENOUS | Status: DC | PRN

## 2012-08-26 MED ORDER — DIBUCAINE 1 % RE OINT: 1.0000 "application " | TOPICAL_OINTMENT | RECTAL | Status: DC | PRN

## 2012-08-26 MED ORDER — MEDROXYPROGESTERONE ACETATE 150 MG/ML IM SUSP: 150.0000 mg | INTRAMUSCULAR | Status: DC | PRN

## 2012-08-26 MED ORDER — BENZOCAINE-MENTHOL 20-0.5 % EX AERO
1.0000 "application " | INHALATION_SPRAY | CUTANEOUS | Status: DC | PRN
  Administered 2012-08-27: 1 via TOPICAL
  Filled 2012-08-26: qty 56

## 2012-08-26 MED ORDER — OXYTOCIN 40 UNITS IN LACTATED RINGERS INFUSION - SIMPLE MED: 62.5000 mL/h | INTRAVENOUS | Status: DC | PRN

## 2012-08-26 MED ORDER — ZOLPIDEM TARTRATE 5 MG PO TABS: 5.0000 mg | ORAL_TABLET | Freq: Every evening | ORAL | Status: DC | PRN

## 2012-08-26 MED ORDER — LANOLIN HYDROUS EX OINT: TOPICAL_OINTMENT | CUTANEOUS | Status: DC | PRN

## 2012-08-26 MED ORDER — BUTORPHANOL TARTRATE 1 MG/ML IJ SOLN
2.0000 mg | INTRAMUSCULAR | Status: DC | PRN
  Administered 2012-08-26: 2 mg via INTRAVENOUS
  Filled 2012-08-26: qty 2

## 2012-08-26 NOTE — Progress Notes (Signed)
Robin Peters is a 32 y.o. [id number] at [redacted]w[redacted]d by LMP admitted for induction of labor due to Non-reactive NST.  Subjective:   Objective: BP 119/74  Pulse 80  Temp 98.3 F (36.8 C) (Oral)  Resp 20  Ht 5\' 7"  (1.702 m)  Wt 215 lb (97.523 kg)  BMI 33.67 kg/m2  SpO2 100%  LMP 11/28/2011 I/O last 3 completed shifts: In: -  Out: 900 [Urine:900]    FHT:  FHR: 150 bpm, variability: moderate,  accelerations:  Present,  decelerations:  Absent UC:   regular, every 3 minutes SVE:   Dilation: 6 Effacement (%): 100 Station: -2 Exam by:: k fields, rn  Labs: Lab Results  Component Value Date   WBC 6.4 08/25/2012   HGB 10.5* 08/25/2012   HCT 32.2* 08/25/2012   MCV 86.1 08/25/2012   PLT 211 08/25/2012    Assessment / Plan: Induction of labor due to non-reassuring fetal testing,  progressing well on pitocin  Labor: Progressing normally Preeclampsia:  n/a Fetal Wellbeing:  Category I Pain Control:  Labor support without medications I/D:  n/a Anticipated MOD:  NSVD  [name] A 08/26/2012, 10:56 AM

## 2012-08-26 NOTE — Progress Notes (Signed)
Robin Peters is a 32 y.o. [id number] at [redacted]w[redacted]d by LMP admitted for induction of labor due to Elective at term.  Subjective:   Objective: BP 137/89  Pulse 93  Temp 98.3 F (36.8 C) (Oral)  Resp 20  Ht 5\' 7"  (1.702 m)  Wt 215 lb (97.523 kg)  BMI 33.67 kg/m2  SpO2 100%  LMP 11/28/2011 I/O last 3 completed shifts: In: -  Out: 900 [Urine:900]    FHT:  FHR: 150 bpm, variability: moderate,  accelerations:  Present,  decelerations:  Absent UC:   regular SVE:   Dilation: 3.5 Effacement (%): 70;80 Station: -2 Exam by:: LCarpenter,RN  Labs: Lab Results  Component Value Date   WBC 6.4 08/25/2012   HGB 10.5* 08/25/2012   HCT 32.2* 08/25/2012   MCV 86.1 08/25/2012   PLT 211 08/25/2012    Assessment / Plan: Induction of labor due to non-reassuring fetal testing,  progressing well on pitocin  Labor: Progressing normally Preeclampsia:  n/a Fetal Wellbeing:  Category I Pain Control:  Epidural I/D:  n/a Anticipated MOD:  NSVD  Robin Peters A 08/26/2012, 9:41 AM

## 2012-08-27 LAB — URINE CULTURE: Colony Count: 15000

## 2012-08-27 LAB — CBC
MCHC: 32.7 g/dL (ref 30.0–36.0)
Platelets: 191 10*3/uL (ref 150–400)
RDW: 14.3 % (ref 11.5–15.5)
WBC: 7.8 10*3/uL (ref 4.0–10.5)

## 2012-08-27 MED ORDER — CEPHALEXIN 500 MG PO CAPS
500.0000 mg | ORAL_CAPSULE | Freq: Four times a day (QID) | ORAL | Status: DC
  Administered 2012-08-27 – 2012-08-28 (×3): 500 mg via ORAL
  Filled 2012-08-27 (×4): qty 1

## 2012-08-27 MED ORDER — DEXTROSE 5 % IV SOLN
1.0000 g | Freq: Two times a day (BID) | INTRAVENOUS | Status: DC
  Filled 2012-08-27: qty 10

## 2012-08-27 MED ORDER — CLINDAMYCIN HCL 300 MG PO CAPS
300.0000 mg | ORAL_CAPSULE | Freq: Four times a day (QID) | ORAL | Status: DC
  Administered 2012-08-27 (×2): 300 mg via ORAL
  Filled 2012-08-27 (×6): qty 1

## 2012-08-27 NOTE — Progress Notes (Signed)
UR chart review completed.  

## 2012-08-27 NOTE — Progress Notes (Signed)
Spoke with Dr. Gaynell Face regarding order to restart rocephin. Pt no longer has IV access and has been afebrile. Order given for Clindamycin PO every 6 hrs.

## 2012-08-27 NOTE — Progress Notes (Signed)
Post Partum Day 1 Subjective: no complaints  Objective: Blood pressure 127/83, pulse 83, temperature 98.6 F (37 C), temperature source Oral, resp. rate 18, height 5\' 7"  (1.702 m), weight 215 lb (97.523 kg), last menstrual period 11/28/2011, SpO2 100.00%, unknown if currently breastfeeding.  Physical Exam:  General: alert and no distress Lochia: appropriate Uterine Fundus: firm Incision: none DVT Evaluation: No evidence of DVT seen on physical exam.   Basename 08/27/12 0555 08/25/12 0700  HGB 10.5* 10.5*  HCT 32.1* 32.2*    Assessment/Plan: Plan for discharge tomorrow   LOS: 3 days   HARPER,CHARLES A 08/27/2012, 9:56 AM

## 2012-08-28 MED ORDER — CEPHALEXIN 500 MG PO CAPS: 500.0000 mg | ORAL_CAPSULE | Freq: Four times a day (QID) | ORAL | Status: DC

## 2012-08-28 MED ORDER — IBUPROFEN 600 MG PO TABS: 600.0000 mg | ORAL_TABLET | Freq: Four times a day (QID) | ORAL | Status: DC

## 2012-08-28 MED ORDER — OXYCODONE-ACETAMINOPHEN 5-325 MG PO TABS: 1.0000 | ORAL_TABLET | ORAL | Status: DC | PRN

## 2012-08-28 NOTE — Discharge Summary (Signed)
Obstetric Discharge Summary Reason for Admission: onset of labor Prenatal Procedures: ultrasound Intrapartum Procedures: spontaneous vaginal delivery Postpartum Procedures: none Complications-Operative and Postpartum: none Hemoglobin  Date Value Range Status  08/27/2012 10.5* 12.0 - 15.0 g/dL Final     HCT  Date Value Range Status  08/27/2012 32.1* 36.0 - 46.0 % Final    Physical Exam:  General: alert and no distress Lochia: appropriate Uterine Fundus: firm Incision: none DVT Evaluation: No evidence of DVT seen on physical exam.  Discharge Diagnoses: Term Pregnancy-delivered  Discharge Information: Date: 08/28/2012 Activity: pelvic rest Diet: routine Medications: PNV, Ibuprofen, Colace and Percocet Condition: stable Instructions: refer to practice specific booklet Discharge to: home Follow-up Information    Follow up with HARPER,CHARLES A, MD. Schedule an appointment as soon as possible for a visit in 6 weeks.   Contact information:   [address] [place] [address] [phone]          Newborn Data: Live born female  Birth Weight: 6 lb 1 oz (2750 g) APGAR: 9, 9  Home with mother.  HARPER,CHARLES A 08/28/2012, 7:04 AM

## 2012-08-28 NOTE — Progress Notes (Signed)
Post Partum Day 2 Subjective: no complaints  Objective: Blood pressure 139/89, pulse 69, temperature 97.6 F (36.4 C), temperature source Oral, resp. rate 18, height 5\' 7"  (1.702 m), weight 215 lb (97.523 kg), last menstrual period 11/28/2011, SpO2 100.00%, unknown if currently breastfeeding.  Physical Exam:  General: alert and no distress Lochia: appropriate Uterine Fundus: firm Incision: none DVT Evaluation: No evidence of DVT seen on physical exam.   Basename 08/27/12 0555 08/25/12 0700  HGB 10.5* 10.5*  HCT 32.1* 32.2*    Assessment/Plan: Discharge home   LOS: 4 days   Romolo Sieling A 08/28/2012, 6:57 AM

## 2014-02-22 ENCOUNTER — Emergency Department (HOSPITAL_BASED_OUTPATIENT_CLINIC_OR_DEPARTMENT_OTHER)
Admission: EM | Admit: 2014-02-22 | Discharge: 2014-02-22 | Disposition: A | Discharge status: Home / Self Care | Payer: Medicaid Other | Attending: Emergency Medicine | Admitting: Emergency Medicine

## 2014-02-22 ENCOUNTER — Encounter (HOSPITAL_BASED_OUTPATIENT_CLINIC_OR_DEPARTMENT_OTHER): Payer: Self-pay | Admitting: Emergency Medicine

## 2014-02-22 DIAGNOSIS — Z8619 Personal history of other infectious and parasitic diseases: Secondary | ICD-10-CM | POA: Insufficient documentation

## 2014-02-22 DIAGNOSIS — Z87891 Personal history of nicotine dependence: Secondary | ICD-10-CM | POA: Insufficient documentation

## 2014-02-22 DIAGNOSIS — N750 Cyst of Bartholin's gland: Secondary | ICD-10-CM | POA: Insufficient documentation

## 2014-02-22 DIAGNOSIS — Z792 Long term (current) use of antibiotics: Secondary | ICD-10-CM | POA: Insufficient documentation

## 2014-02-22 DIAGNOSIS — Z79899 Other long term (current) drug therapy: Secondary | ICD-10-CM | POA: Insufficient documentation

## 2014-02-22 DIAGNOSIS — Z3202 Encounter for pregnancy test, result negative: Secondary | ICD-10-CM | POA: Insufficient documentation

## 2014-02-22 LAB — PREGNANCY, URINE: PREG TEST UR: NEGATIVE

## 2014-02-22 MED ORDER — HYDROCODONE-ACETAMINOPHEN 5-325 MG PO TABS: 1.0000 | ORAL_TABLET | ORAL | Status: DC | PRN

## 2014-02-22 NOTE — ED Notes (Signed)
Cyst in vaginal that has become larger and more painful over past month. Pt recently began sexual activity again and states has been worse since.

## 2014-02-22 NOTE — Discharge Instructions (Signed)
Bartholin's Cyst or Abscess °Bartholin's glands are small glands located within the folds of skin (labia) along the sides of the lower opening of the vagina (birth canal). A cyst may develop when the duct of the gland becomes blocked. When this happens, fluid that accumulates within the cyst can become infected. This is known as an abscess. The Bartholin gland produces a mucous fluid to lubricate the outside of the vagina during sexual intercourse. °SYMPTOMS  °· Patients with a small cyst may not have any symptoms. °· Mild discomfort to severe pain depending on the size of the cyst and if it is infected (abscess). °· Pain, redness, and swelling around the lower opening of the vagina. °· Painful intercourse. °· Pressure in the perineal area. °· Swelling of the lips of the vagina (labia). °· The cyst or abscess can be on one side or both sides of the vagina. °DIAGNOSIS  °· A large swelling is seen in the lower vagina area by your caregiver. °· Painful to touch. °· Redness and pain, if it is an abscess. °TREATMENT  °· Sometimes the cyst will go away on its own. °· Apply warm wet compresses to the area or take hot sitz baths several times a day. °· An incision to drain the cyst or abscess with local anesthesia. °· Culture the pus, if it is an abscess. °· Antibiotic treatment, if it is an abscess. °· Cut open the gland and suture the edges to make the opening of the gland bigger (marsupialization). °· Remove the whole gland if the cyst or abscess returns. °PREVENTION  °· Practice good hygiene. °· Clean the vaginal area with a mild soap and soft cloth when bathing. °· Do not rub hard in the vaginal area when bathing. °· Protect the crotch area with a padded cushion if you take long bike rides or ride horses. °· Be sure you are well lubricated when you have sexual intercourse. °HOME CARE INSTRUCTIONS  °· If your cyst or abscess was opened, a small piece of gauze, or a drain, may have been placed in the wound to allow  drainage. Do not remove this gauze or drain unless directed by your caregiver. °· Wear feminine pads, not tampons, as needed for any drainage or bleeding. °· If antibiotics were prescribed, take them exactly as directed. Finish the entire course. °· Only take over-the-counter or prescription medicines for pain, discomfort, or fever as directed by your caregiver. °SEEK IMMEDIATE MEDICAL CARE IF:  °· You have an increase in pain, redness, swelling, or drainage. °· You have bleeding from the wound which results in the use of more than the number of pads suggested by your caregiver in 24 hours. °· You have chills. °· You have a fever. °· You develop any new problems (symptoms) or aggravation of your existing condition. °MAKE SURE YOU:  °· Understand these instructions. °· Will watch your condition. °· Will get help right away if you are not doing well or get worse. °Document Released: 08/04/2005 Document Revised: 10/27/2011 Document Reviewed: 03/22/2008 °ExitCare® Patient Information ©2015 [employer]. This information is not intended to replace advice given to you by your health care provider. Make sure you discuss any questions you have with your health care provider. ° °

## 2014-02-22 NOTE — ED Provider Notes (Signed)
TIME SEEN: 1:39 PM  CHIEF COMPLAINT: Bartholin's cyst  HPI: Patient is a 33 y.o. G5P3A2 with prior history of Bartholin's cyst, Trichomonas who presents to the emergency department with Bartholin cyst that has been getting larger since Friday 5 days ago when she started her menstrual cycle. She denies any dysuria or hematuria. No vaginal discharge. She is sectioned active with one partner and they use condoms. No fevers, chills, nausea, vomiting or diarrhea.  ROS: See HPI Constitutional: no fever  Eyes: no drainage  ENT: no runny nose   Cardiovascular:  no chest pain  Resp: no SOB  GI: no vomiting GU: no dysuria Integumentary: no rash  Allergy: no hives  Musculoskeletal: no leg swelling  Neurological: no slurred speech ROS otherwise negative  PAST MEDICAL HISTORY/PAST SURGICAL HISTORY:  Past Medical History  Diagnosis Date  . Bartholin cyst   . Trichimoniasis   . Labial cyst   . Pregnancy induced hypertension   . SVT (supraventricular tachycardia)     MEDICATIONS:  Prior to Admission medications   Medication Sig Start Date End Date Taking? Authorizing Provider  amoxicillin-clavulanate (AUGMENTIN) 875-125 MG per tablet Take 1 tablet by mouth 2 (two) times daily. X 14 days.    Historical Provider, MD  butalbital-acetaminophen-caffeine (FIORICET, ESGIC) 50-325-40 MG per tablet Take 1 tablet by mouth 2 (two) times daily as needed. For pain    Historical Provider, MD  cephALEXin (KEFLEX) 500 MG capsule Take 1 capsule (500 mg total) by mouth 4 (four) times daily. 08/28/12   Brock Badharles A Harper, MD  ibuprofen (ADVIL,MOTRIN) 600 MG tablet Take 1 tablet (600 mg total) by mouth every 6 (six) hours. 08/28/12   Brock Badharles A Harper, MD  nitrofurantoin, macrocrystal-monohydrate, (MACROBID) 100 MG capsule Take 100 mg by mouth 2 (two) times daily.    Historical Provider, MD  oxyCODONE-acetaminophen (PERCOCET/ROXICET) 5-325 MG per tablet Take 1-2 tablets by mouth every 4 (four) hours as needed for pain  (moderate - severe pain). 08/28/12   Brock Badharles A Harper, MD  Prenatal Vit-Fe Fumarate-FA (PRENATAL MULTIVITAMIN) TABS Take 1 tablet by mouth at bedtime.    Historical Provider, MD  vitamin D, CHOLECALCIFEROL, 400 UNITS tablet Take 800 Units by mouth daily.    Historical Provider, MD    ALLERGIES:  Allergies  Allergen Reactions  . Stadol [Butorphanol] Itching and Other (See Comments)    Burning    SOCIAL HISTORY:  History  Substance Use Topics  . Smoking status: Former Smoker -- 0.25 packs/day    Quit date: 01/23/2012  . Smokeless tobacco: Never Used  . Alcohol Use: No    FAMILY HISTORY: Family History  Problem Relation Age of Onset  . Heart disease      Adopted and unknown family history    EXAM: BP 162/131  Pulse 80  Temp(Src) 98 F (36.7 C) (Oral)  Resp 18  Wt 220 lb (99.791 kg)  SpO2 100%  LMP 02/21/2014  Breastfeeding? No CONSTITUTIONAL: Alert and oriented and responds appropriately to questions. Well-appearing; well-nourished HEAD: Normocephalic EYES: Conjunctivae clear, PERRL ENT: normal nose; no rhinorrhea; moist mucous membranes; pharynx without lesions noted NECK: Supple, no meningismus, no LAD  CARD: RRR; S1 and S2 appreciated; no murmurs, no clicks, no rubs, no gallops RESP: Normal chest excursion without splinting or tachypnea; breath sounds clear and equal bilaterally; no wheezes, no rhonchi, no rales,  ABD/GI: Normal bowel sounds; non-distended; soft, non-tender, no rebound, no guarding GU:  Patient has a large right labial Bartholin's cyst with a large amount  of purulent drainage with no surrounding signs of induration or erythema or warmth, no other lesions appreciated, patient is currently on her menstrual cycle BACK:  The back appears normal and is non-tender to palpation, there is no CVA tenderness EXT: Normal ROM in all joints; non-tender to palpation; no edema; normal capillary refill; no cyanosis    SKIN: Normal color for age and race; warm NEURO:  Moves all extremities equally PSYCH: The patient's mood and manner are appropriate. Grooming and personal hygiene are appropriate.  MEDICAL DECISION MAKING: Patient here with Bartholin's cyst that has been drained at bedside. She has requested that we do not put a catheter in place. She was initially hypertensive when she arrived but suspect this is due to pain. We'll recheck. We'll also check pregnancy test. She has no other associated symptoms and is well-appearing, nontoxic. Her GYN is Dr. Clearance CootsHarper. We'll have her followup with her OB/GYN. We'll discharge with pain medication.  ED PROGRESS: Urine pregnancy test is negative. Her blood pressure has improved. Will discharge home. Discussed supportive care instructions. Discussed return precautions. She verbalized understanding and is comfortable with plan.     Layla MawKristen N Leylany Nored, DO 02/22/14 1452

## 2014-06-19 ENCOUNTER — Encounter (HOSPITAL_BASED_OUTPATIENT_CLINIC_OR_DEPARTMENT_OTHER): Payer: Self-pay | Admitting: Emergency Medicine

## 2014-08-08 IMAGING — US US UA CORD DOPPLER
1 series · 14 of 16 positions shown · non-contrast
Comparison: none

CLINICAL DATA: Pelvic pain.  Nonreactive nonstress test.  38-week-2-
day gestational age.  Evaluate BPD and umbilical artery Doppler.

[Series 1: us fetal bpp w/o nonstress · non-contrast · 14 of 16 slices shown]
[im 1/16]
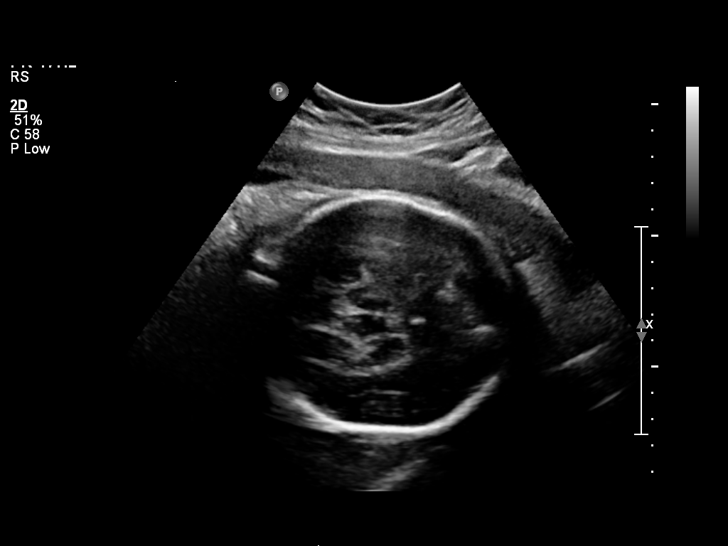
[im 2/16]
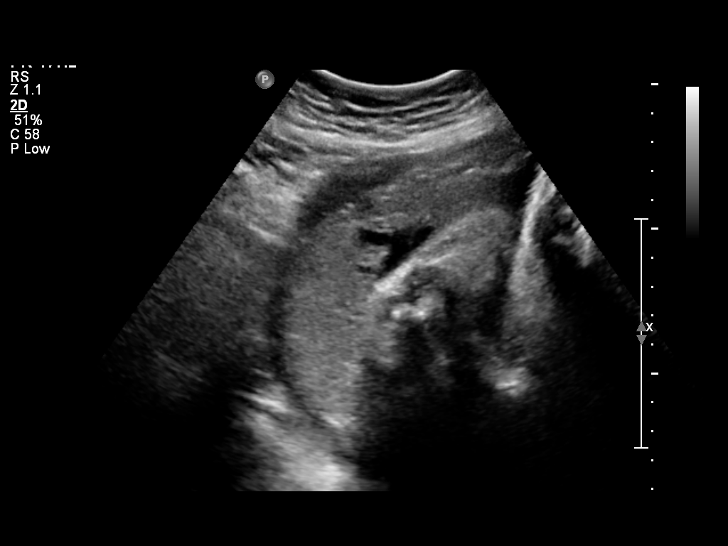
[im 3/16]
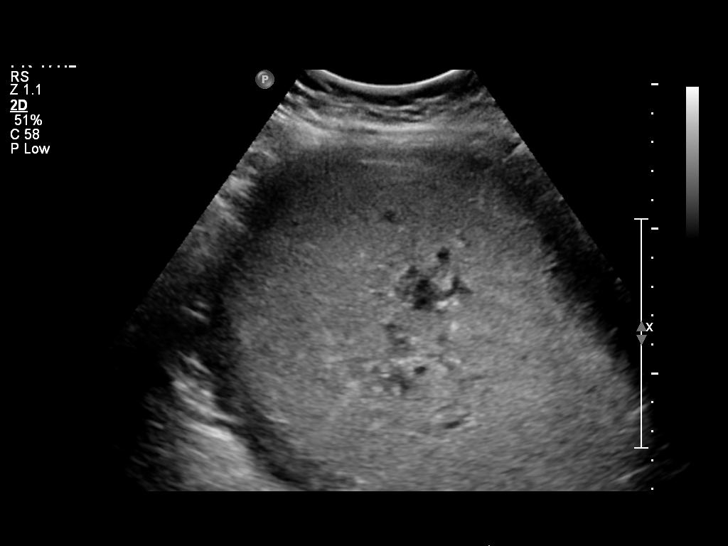
[im 5/16]
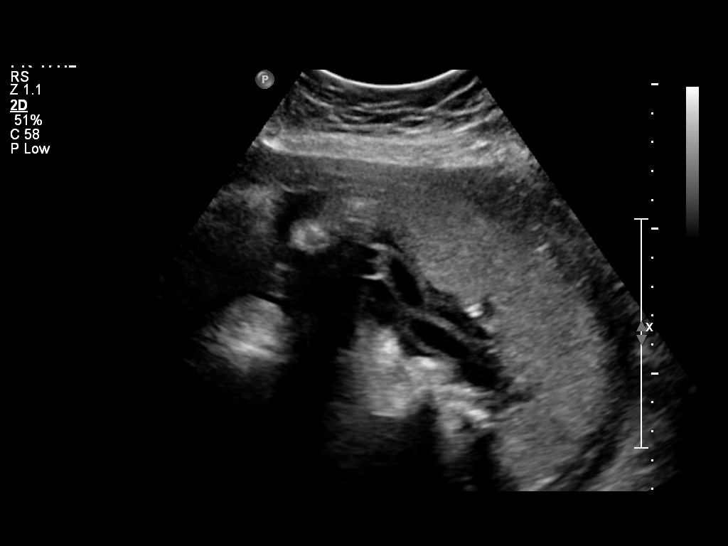
[im 6/16]
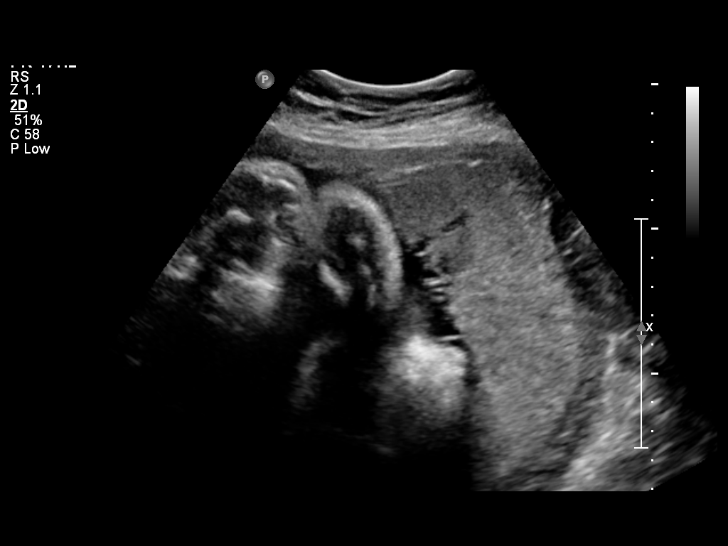
[im 7/16]
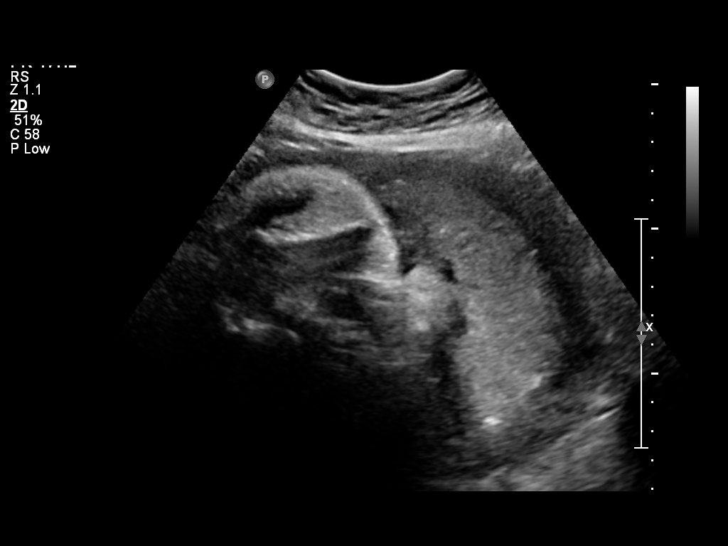
[im 8/16]
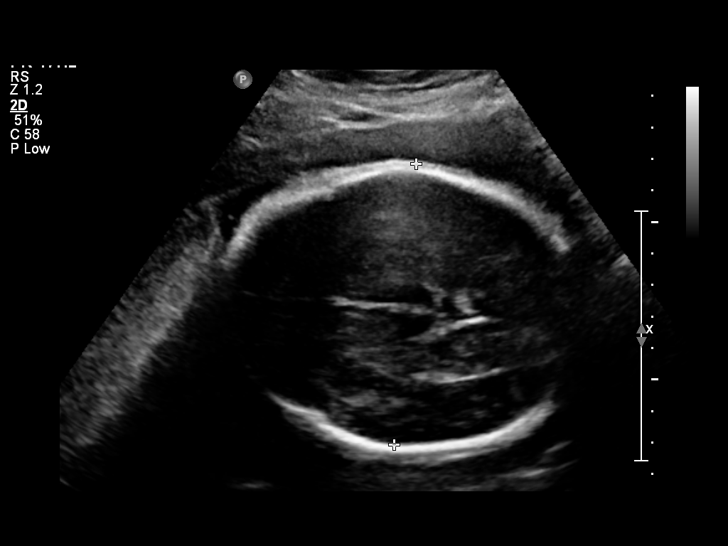
[im 9/16]
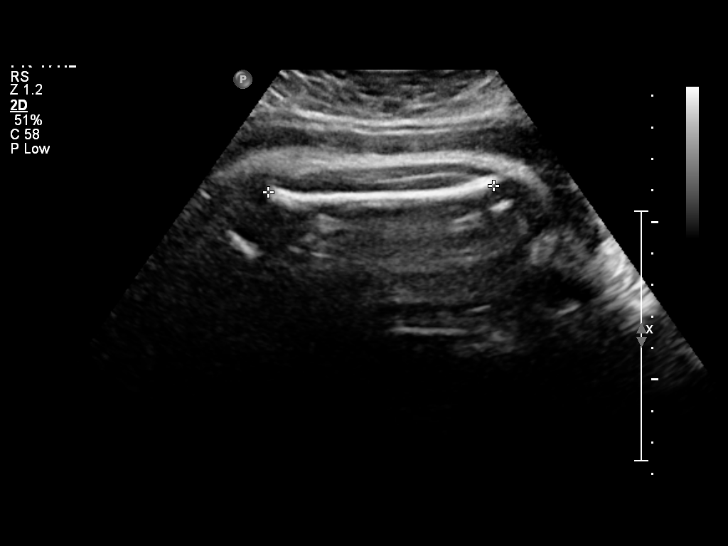
[im 10/16]
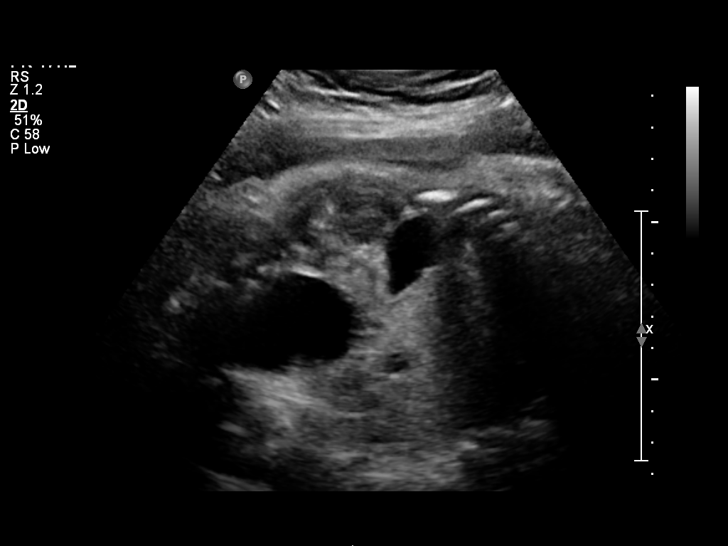
[im 11/16]
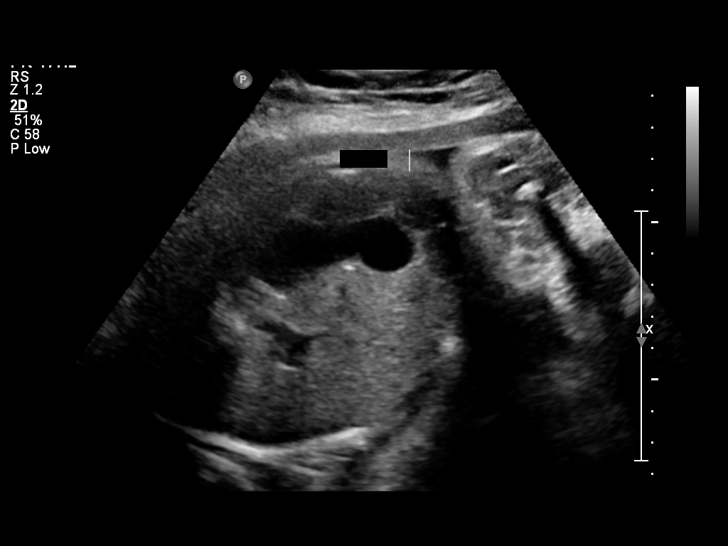
[im 13/16]
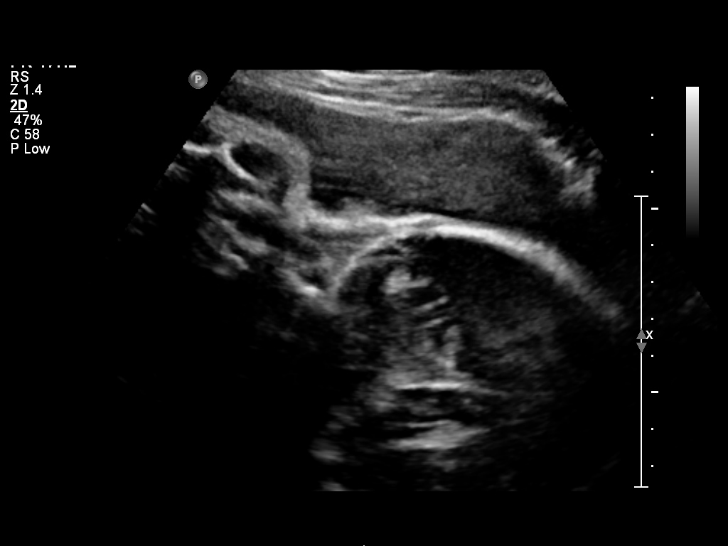
[im 14/16]
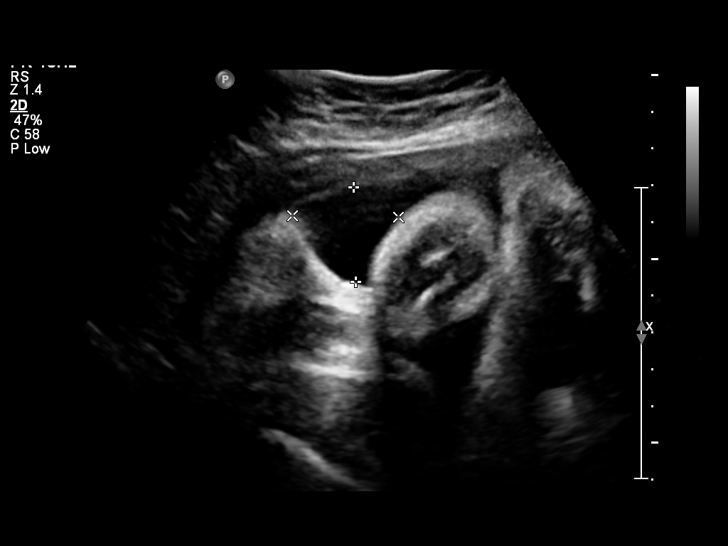
[im 15/16]
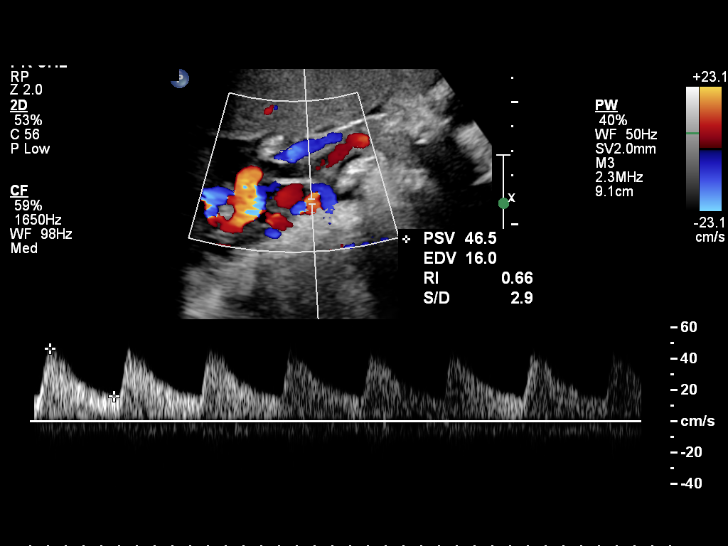
[im 16/16]
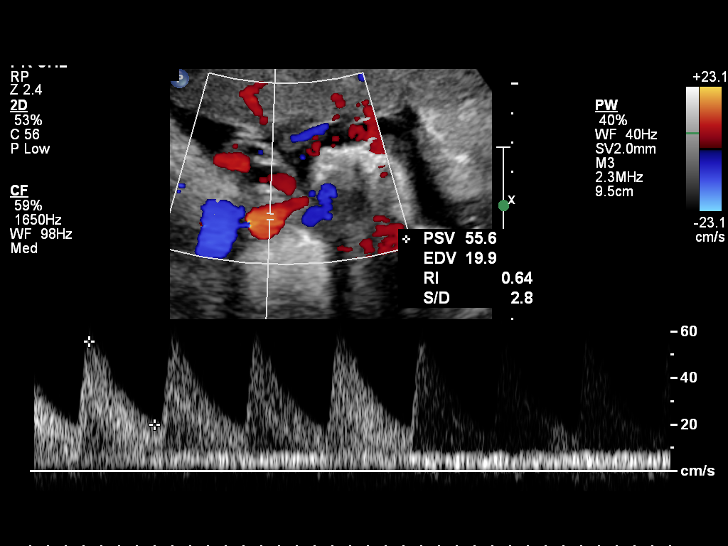

[14 of 16 positions shown; findings below may reference images not displayed]

BIOPHYSICAL PROFILE

Number of Fetuses: 1
Heart Rate: 125 bpm
Presentation: Cephalic
Movement: No
Placental Location:  Left posterior
Previa:  No
Amniotic Fluid (Subjective):  Subjectively decreased

Vertical pocket:  2.6 cm

FL:  7.1 cm 36 w  4 d

MATERNAL FINDINGS:
Cervix:  Not evaluated by sonographer

BPP:
Movement:  0      Time:  30 minutes
Breathing: 0
Tone:   0
Amniotic Fluid:  2

Total Score:  [DATE]
IMPRESSION: Biophysical profile score of [DATE].

FETAL UMBILICAL ARTERY DOPPLER ULTRASOUND

Umbilical Artery S/D Ratio: 2.9    84th percentile
IMPRESSION: Umbilical artery S/D ratio is in the upper normal range for
gestational age, currently 84th percentile.

## 2014-08-09 IMAGING — CT CT HEAD W/O CM
2 series · 16 of 30 positions shown, 20 images · non-contrast
Comparison: None.

CLINICAL DATA: Fall yesterday with frontal headache and posterior
right headache.

CT HEAD WITHOUT CONTRAST
TECHNIQUE: Contiguous axial images were obtained from the base of
the skull through the vertex without contrast.

[Series 2: routine head w/o · axial · non-contrast · 0.46mm/px · z∈[+7,+122]mm · 13 of 28 slices shown, 17 images]
[im 2/28  brain]
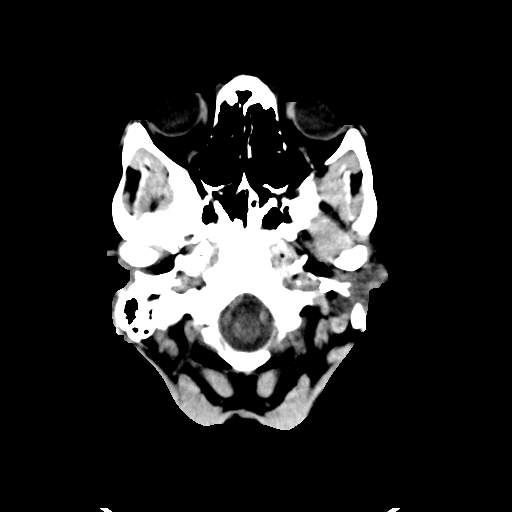
[im 2/28  bone]
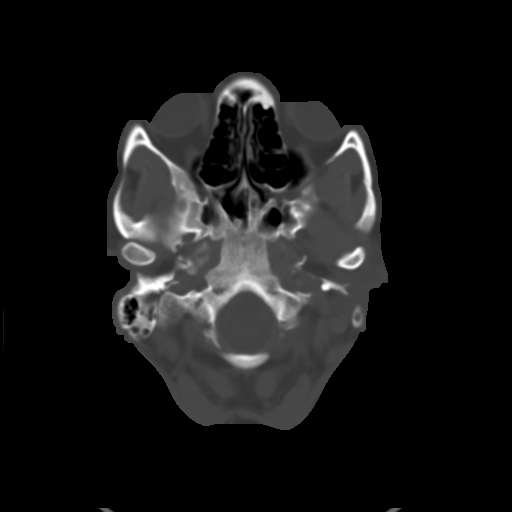
[im 4/28  brain]
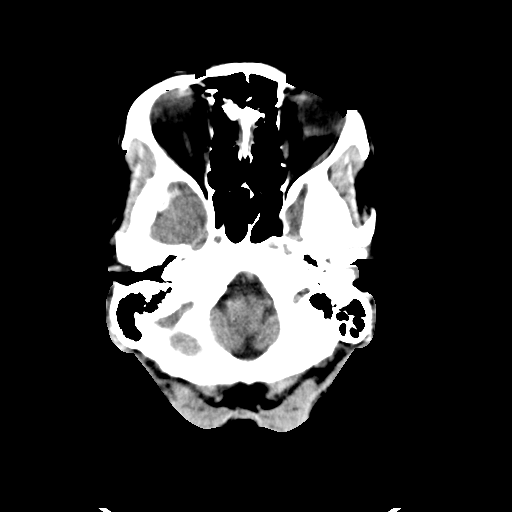
[im 6/28  brain]
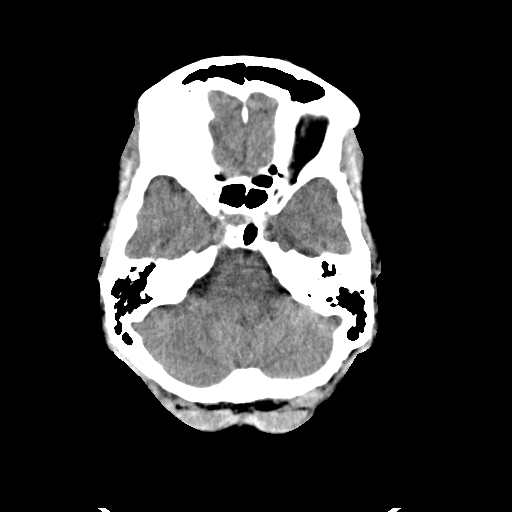
[im 8/28  brain]
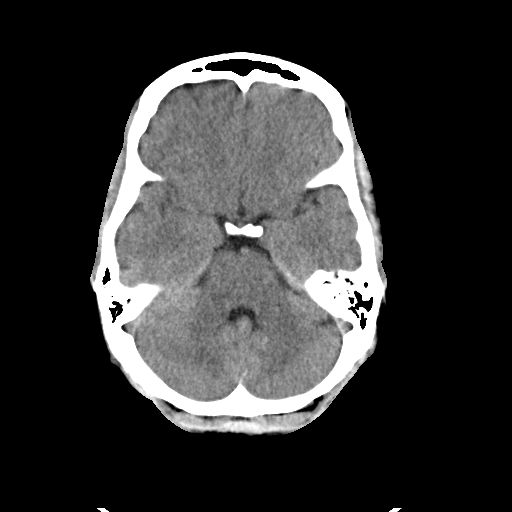
[im 10/28  brain]
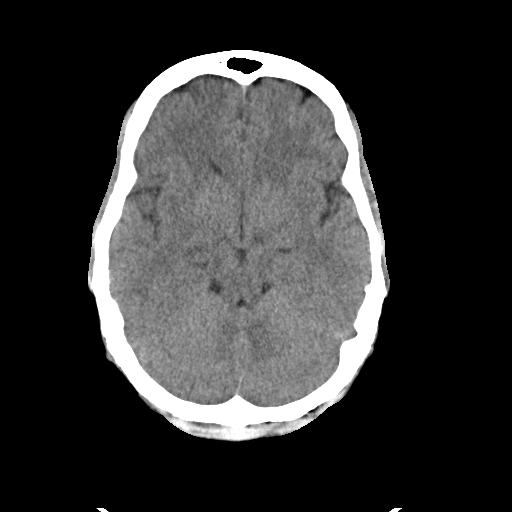
[im 10/28  bone]
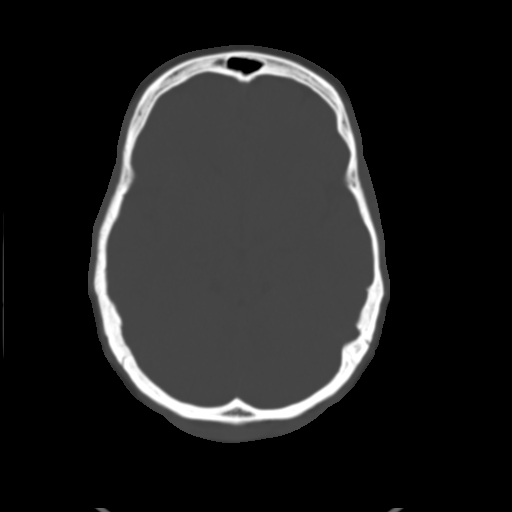
[im 12/28  brain]
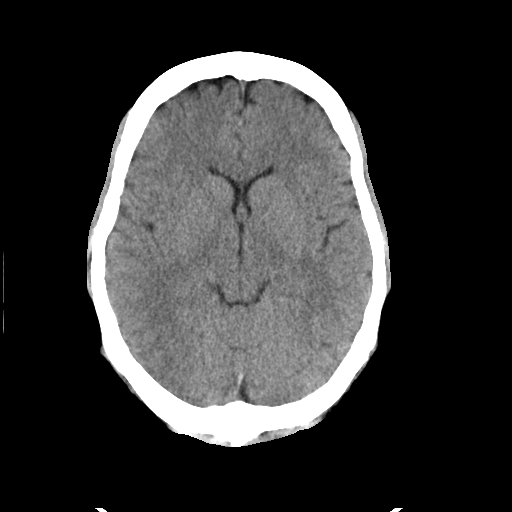
[im 14/28  brain]
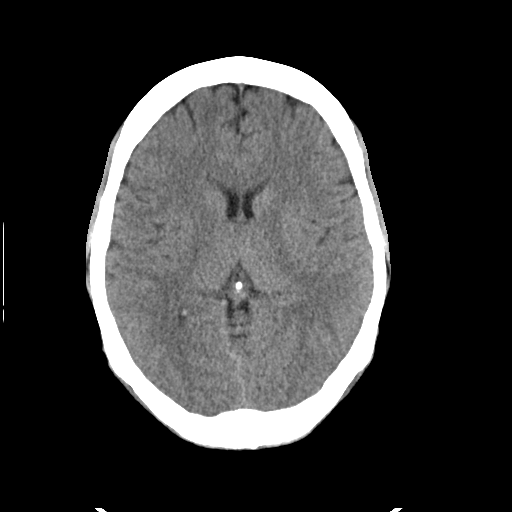
[im 16/28  brain]
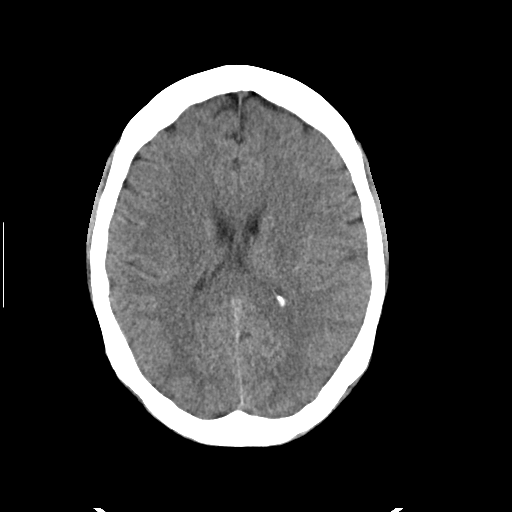
[im 18/28  brain]
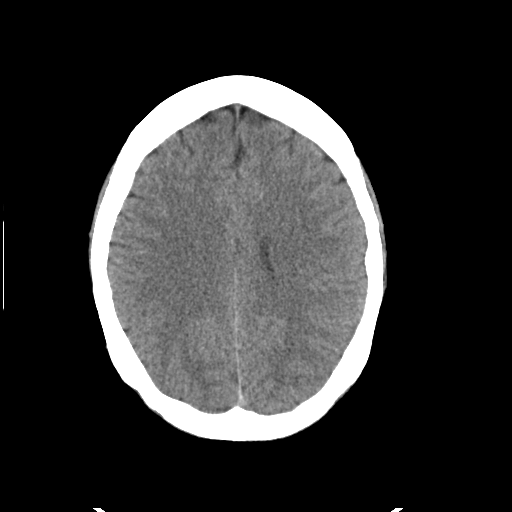
[im 18/28  bone]
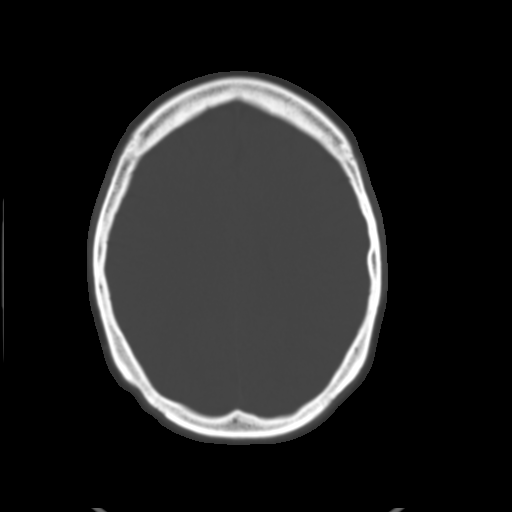
[im 20/28  brain]
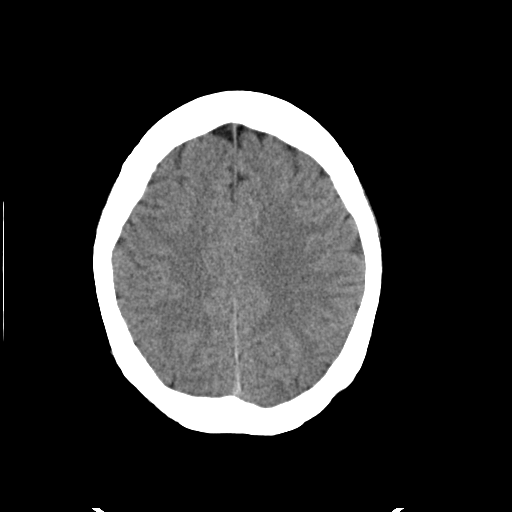
[im 22/28  brain]
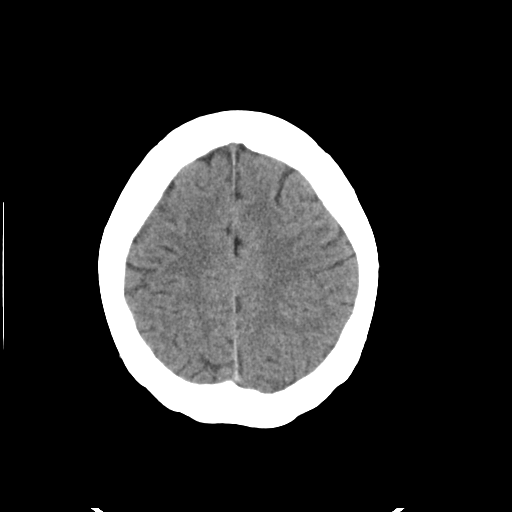
[im 24/28  brain]
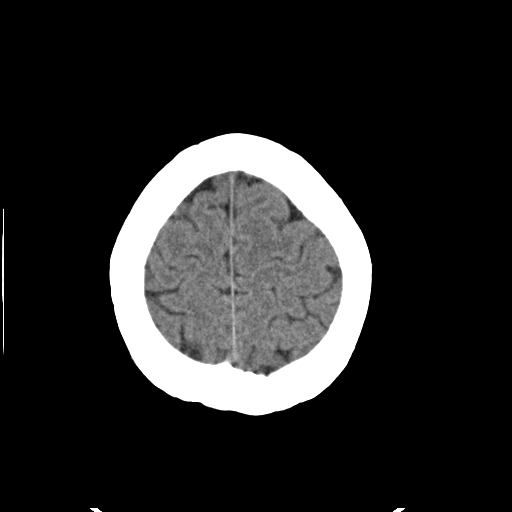
[im 26/28  brain]
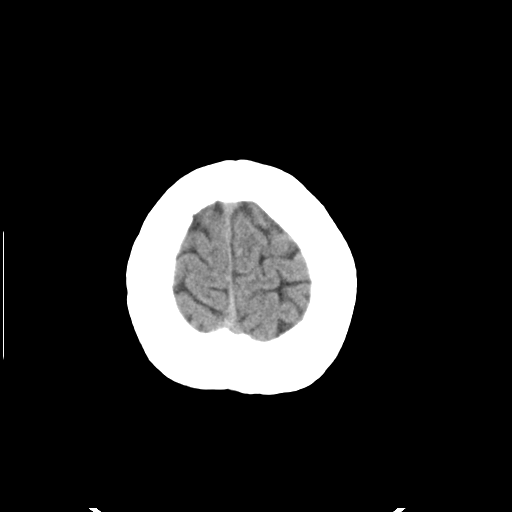
[im 26/28  bone]
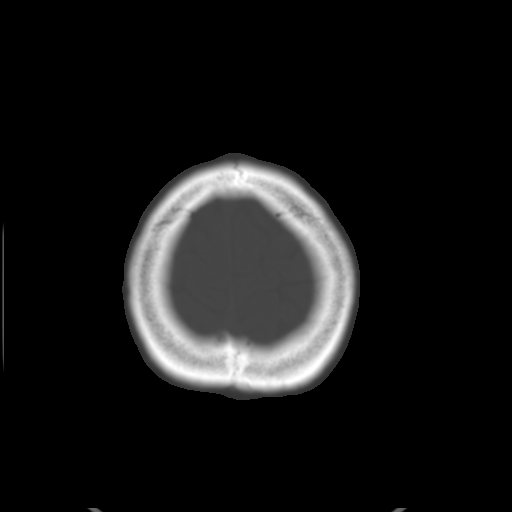

[Series 3: routine head bone · axial · 0.46mm/px · z∈[+7,+45]mm · 3 of 28 slices shown]
[im 2/28  bone]
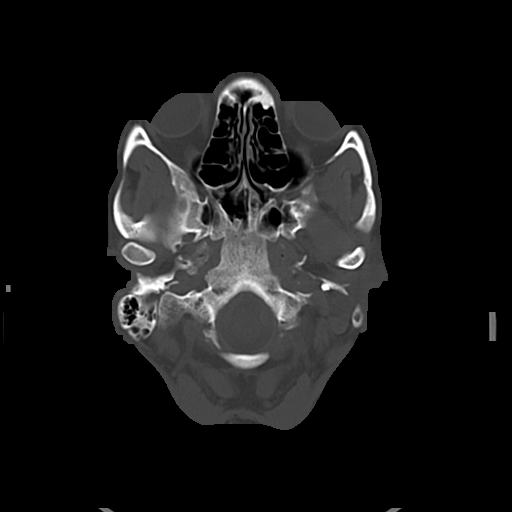
[im 6/28  bone]
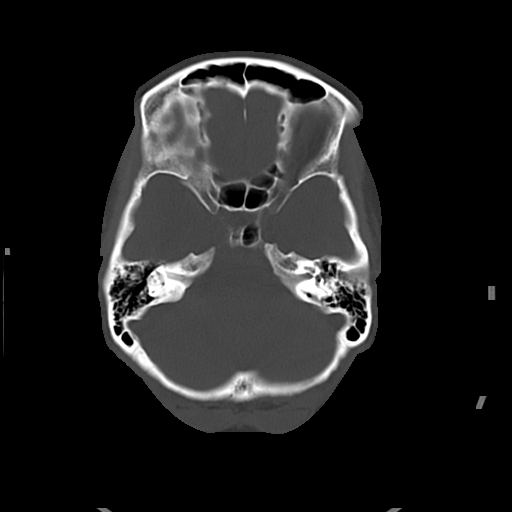
[im 10/28  bone]
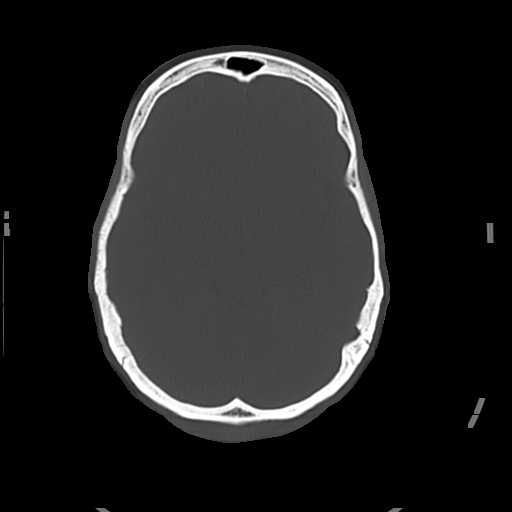

[16 of 30 positions shown; findings below may reference images not displayed]

FINDINGS: Bone windows demonstrate no significant soft tissue
swelling.  Clear paranasal sinuses and mastoid air cells.

Soft tissue windows demonstrate no  mass lesion, hemorrhage,
hydrocephalus, acute infarct, intra-axial, or extra-axial fluid
collection.
IMPRESSION: Normal head CT.

## 2015-12-12 ENCOUNTER — Encounter (HOSPITAL_BASED_OUTPATIENT_CLINIC_OR_DEPARTMENT_OTHER): Payer: Self-pay

## 2015-12-12 ENCOUNTER — Emergency Department (HOSPITAL_BASED_OUTPATIENT_CLINIC_OR_DEPARTMENT_OTHER)
Admission: EM | Admit: 2015-12-12 | Discharge: 2015-12-12 | Disposition: A | Discharge status: Home / Self Care | Payer: Medicaid Other | Attending: Emergency Medicine | Admitting: Emergency Medicine

## 2015-12-12 DIAGNOSIS — N390 Urinary tract infection, site not specified: Secondary | ICD-10-CM | POA: Diagnosis not present

## 2015-12-12 DIAGNOSIS — R300 Dysuria: Secondary | ICD-10-CM | POA: Diagnosis present

## 2015-12-12 DIAGNOSIS — I471 Supraventricular tachycardia: Secondary | ICD-10-CM | POA: Diagnosis not present

## 2015-12-12 DIAGNOSIS — F17200 Nicotine dependence, unspecified, uncomplicated: Secondary | ICD-10-CM | POA: Insufficient documentation

## 2015-12-12 LAB — URINALYSIS, ROUTINE W REFLEX MICROSCOPIC
Bilirubin Urine: NEGATIVE
GLUCOSE, UA: NEGATIVE mg/dL
Hgb urine dipstick: NEGATIVE
Ketones, ur: NEGATIVE mg/dL
Nitrite: POSITIVE — AB
PH: 7 (ref 5.0–8.0)
PROTEIN: NEGATIVE mg/dL
Specific Gravity, Urine: 1.013 (ref 1.005–1.030)

## 2015-12-12 LAB — URINE MICROSCOPIC-ADD ON: RBC / HPF: NONE SEEN RBC/hpf (ref 0–5)

## 2015-12-12 LAB — PREGNANCY, URINE: PREG TEST UR: NEGATIVE

## 2015-12-12 LAB — CBG MONITORING, ED: GLUCOSE-CAPILLARY: 120 mg/dL — AB (ref 65–99)

## 2015-12-12 MED ORDER — FLUCONAZOLE 200 MG PO TABS: 200.0000 mg | ORAL_TABLET | Freq: Once | ORAL | Status: AC

## 2015-12-12 MED ORDER — CEPHALEXIN 500 MG PO CAPS: 500.0000 mg | ORAL_CAPSULE | Freq: Four times a day (QID) | ORAL | Status: DC

## 2015-12-12 MED FILL — FLUCONAZOLE 200 MG TABLET: 200 | 1 days supply | Qty: 1 | Fill #0

## 2015-12-12 MED FILL — CEPHALEXIN 500 MG CAPSULE: 500 | 7 days supply | Qty: 28 | Fill #0

## 2015-12-12 NOTE — ED Provider Notes (Signed)
CSN: [id number]     Arrival date & time 12/12/15  1208 History   First MD Initiated Contact with Patient 12/12/15 1210     Chief Complaint  Patient presents with  . Dysuria     (Consider location/radiation/quality/duration/timing/severity/associated sxs/prior Treatment) HPI Comments: 35 year old female with no significant past medical history presents for painful urination. The patient reports that off and on for quite some time she's been having pain with urination as well as foul-smelling urine and pressure in her bladder. She has been trying to treat this by drinking lots of fluids and over-the-counter medications. She just got Medicaid and couldn't handle the symptoms anymore so came in for evaluation. She denies fevers or chills. She does report intermittently did feel like it went up to her flank but that has resolved.   Past Medical History  Diagnosis Date  . Bartholin cyst   . Trichimoniasis   . Labial cyst   . Pregnancy induced hypertension   . SVT (supraventricular tachycardia) [facility](HCC)    Past Surgical History  Procedure Laterality Date  . Wisdom tooth extraction    . Dilation and curettage of uterus     Family History  Problem Relation Age of Onset  . Heart disease      Adopted and unknown family history   Social History  Substance Use Topics  . Smoking status: Current Every Day Smoker -- 0.25 packs/day    Last Attempt to Quit: 01/23/2012  . Smokeless tobacco: Never Used  . Alcohol Use: Yes     Comment: occasional   OB History    Gravida Para Term Preterm AB TAB SAB Ectopic Multiple Living   5 3 3  2 1 1   2      Review of Systems  HENT: Negative for congestion, postnasal drip, rhinorrhea and sore throat.   Respiratory: Negative for chest tightness and shortness of breath.   Cardiovascular: Negative for chest pain and palpitations.  Gastrointestinal: Negative for nausea, vomiting, abdominal pain and diarrhea.  Genitourinary: Positive for dysuria, urgency and  flank pain (resolved). Negative for hematuria, vaginal bleeding, vaginal discharge and vaginal pain.  Musculoskeletal: Negative for myalgias and back pain.  Skin: Negative for rash.  Neurological: Negative for dizziness, weakness and headaches.  Hematological: Does not bruise/bleed easily.      Allergies  Stadol  Home Medications   Prior to Admission medications   Medication Sig Start Date End Date Taking? Authorizing Provider  amoxicillin-clavulanate (AUGMENTIN) 875-125 MG per tablet Take 1 tablet by mouth 2 (two) times daily. X 14 days.    Historical Provider, MD  butalbital-acetaminophen-caffeine (FIORICET, ESGIC) 50-325-40 MG per tablet Take 1 tablet by mouth 2 (two) times daily as needed. For pain    Historical Provider, MD  cephALEXin (KEFLEX) 500 MG capsule Take 1 capsule (500 mg total) by mouth 4 (four) times daily. 08/28/12   Brock Badharles A Harper, MD  HYDROcodone-acetaminophen (NORCO/VICODIN) 5-325 MG per tablet Take 1 tablet by mouth every 4 (four) hours as needed. 02/22/14   Kristen N Ward, DO  ibuprofen (ADVIL,MOTRIN) 600 MG tablet Take 1 tablet (600 mg total) by mouth every 6 (six) hours. 08/28/12   Brock Badharles A Harper, MD  nitrofurantoin, macrocrystal-monohydrate, (MACROBID) 100 MG capsule Take 100 mg by mouth 2 (two) times daily.    Historical Provider, MD  oxyCODONE-acetaminophen (PERCOCET/ROXICET) 5-325 MG per tablet Take 1-2 tablets by mouth every 4 (four) hours as needed for pain (moderate - severe pain). 08/28/12   Brock Badharles A Harper, MD  Prenatal Vit-Fe Fumarate-FA (PRENATAL MULTIVITAMIN) TABS Take 1 tablet by mouth at bedtime.    Historical Provider, MD  vitamin D, CHOLECALCIFEROL, 400 UNITS tablet Take 800 Units by mouth daily.    Historical Provider, MD   BP 138/98 mmHg  Pulse 79  Temp(Src) 97.6 F (36.4 C) (Oral)  Resp 15  Ht  (1.702 m)  Wt 210 lb (95.255 kg)  BMI 32.88 kg/m2  SpO2 100%  LMP 11/22/2015 Physical Exam  Constitutional: She is oriented to person,  place, and time. She appears well-developed and well-nourished. No distress.  HENT:  Head: Normocephalic and atraumatic.  Right Ear: External ear normal.  Left Ear: External ear normal.  Nose: Nose normal.  Mouth/Throat: Oropharynx is clear and moist. No oropharyngeal exudate.  Eyes: EOM are normal. Pupils are equal, round, and reactive to light.  Neck: Normal range of motion. Neck supple.  Cardiovascular: Normal rate, regular rhythm, normal heart sounds and intact distal pulses.   No murmur heard. Pulmonary/Chest: Effort normal. No respiratory distress. She has no wheezes. She has no rales.  Abdominal: Soft. She exhibits no distension. There is no tenderness.  Musculoskeletal: Normal range of motion. She exhibits no edema or tenderness.  Neurological: She is alert and oriented to person, place, and time.  Skin: Skin is warm and dry. No rash noted. She is not diaphoretic.  Vitals reviewed.   ED Course  Procedures (including critical care time) Labs Review Labs Reviewed  URINALYSIS, ROUTINE W REFLEX MICROSCOPIC (NOT AT [facility]) - Abnormal; Notable for the following:    APPearance CLOUDY (*)    Nitrite POSITIVE (*)    Leukocytes, UA TRACE (*)    All other components within normal limits  URINE MICROSCOPIC-ADD ON - Abnormal; Notable for the following:    Squamous Epithelial / LPF 0-5 (*)    Bacteria, UA MANY (*)    All other components within normal limits  CBG MONITORING, ED - Abnormal; Notable for the following:    Glucose-Capillary 120 (*)    All other components within normal limits  PREGNANCY, URINE    Imaging Review No results found. I have personally reviewed and evaluated these images and lab results as part of my medical decision-making.   EKG Interpretation None      MDM  Patient was seen and evaluated in stable condition.  Patient well-appearing with stable vital signs. Urine consistent with infection. Patient felt comfortable with plan for discharge with  outpatient antibiotics. Patient discharged with a prescription for Keflex. She is going to follow up with her primary care physician outpatient. Final diagnoses:  None    1. UTI    Leta Baptist, MD 12/12/15 1415

## 2015-12-12 NOTE — Discharge Instructions (Signed)

## 2015-12-12 NOTE — ED Notes (Signed)
Pt reports dysuria, foul smelling urine, bladder pressure, intermittent for the past week, progressive worsening over the past few days.

## 2015-12-14 LAB — URINE CULTURE

## 2015-12-15 ENCOUNTER — Telehealth: Payer: Self-pay

## 2015-12-15 NOTE — Telephone Encounter (Signed)
Post ED Visit - Positive Culture Follow-up  Culture report reviewed by antimicrobial stewardship pharmacist:  []  Enzo BiNathan Batchelder, Pharm.D. []  Celedonio MiyamotoJeremy Frens, Pharm.D., BCPS []  Garvin FilaMike Maccia, Pharm.D. []  Georgina PillionElizabeth Martin, Pharm.D., BCPS []  Harbor SpringsMinh Pham, [address]Pharm[address]D., BCPS, AAHIVP []  Estella HuskMichelle Turner, Pharm.D., BCPS, AAHIVP []  Tennis Mustassie Stewart, Pharm.D. []  Rob Oswaldo DoneVincent, [address]Pharm[address]D.  Delle Reiningorey Ball Pharm D Positive urine culture Treated with Cephalexin, organism sensitive to the same and no further patient follow-up is required at this time.  Jerry CarasCullom, Ninfa Giannelli Burnett 12/15/2015, 9:46 AM

## 2016-04-03 ENCOUNTER — Encounter (HOSPITAL_BASED_OUTPATIENT_CLINIC_OR_DEPARTMENT_OTHER): Payer: Self-pay | Admitting: *Deleted

## 2016-04-03 ENCOUNTER — Emergency Department (HOSPITAL_BASED_OUTPATIENT_CLINIC_OR_DEPARTMENT_OTHER)
Admission: EM | Admit: 2016-04-03 | Discharge: 2016-04-03 | Disposition: A | Discharge status: Home / Self Care | Payer: Medicaid Other | Attending: Dermatology | Admitting: Dermatology

## 2016-04-03 DIAGNOSIS — I10 Essential (primary) hypertension: Secondary | ICD-10-CM | POA: Insufficient documentation

## 2016-04-03 DIAGNOSIS — Z5321 Procedure and treatment not carried out due to patient leaving prior to being seen by health care provider: Secondary | ICD-10-CM | POA: Diagnosis not present

## 2016-04-03 DIAGNOSIS — F17200 Nicotine dependence, unspecified, uncomplicated: Secondary | ICD-10-CM | POA: Diagnosis not present

## 2016-04-03 NOTE — ED Triage Notes (Signed)
Pt c/o recent HTN dx approx 3 months ago-was started on meds-pt noncompliant-stopped taking meds after 1 month-CP x today-NAD-steady gait

## 2016-04-03 NOTE — ED Notes (Signed)
Pt reports,  she is leaving unwilling to stay any longer , states if she feels worst she will return if not shell return in am to be seen , pt is alert oriented

## 2016-08-05 ENCOUNTER — Encounter (HOSPITAL_BASED_OUTPATIENT_CLINIC_OR_DEPARTMENT_OTHER): Payer: Self-pay | Admitting: *Deleted

## 2016-08-05 ENCOUNTER — Emergency Department (HOSPITAL_BASED_OUTPATIENT_CLINIC_OR_DEPARTMENT_OTHER)
Admission: EM | Admit: 2016-08-05 | Discharge: 2016-08-05 | Disposition: A | Discharge status: Home / Self Care | Payer: Medicaid Other | Attending: Emergency Medicine | Admitting: Emergency Medicine

## 2016-08-05 DIAGNOSIS — N390 Urinary tract infection, site not specified: Secondary | ICD-10-CM | POA: Diagnosis not present

## 2016-08-05 DIAGNOSIS — I10 Essential (primary) hypertension: Secondary | ICD-10-CM | POA: Diagnosis not present

## 2016-08-05 DIAGNOSIS — R202 Paresthesia of skin: Secondary | ICD-10-CM | POA: Diagnosis not present

## 2016-08-05 DIAGNOSIS — R200 Anesthesia of skin: Secondary | ICD-10-CM | POA: Diagnosis present

## 2016-08-05 DIAGNOSIS — F17200 Nicotine dependence, unspecified, uncomplicated: Secondary | ICD-10-CM | POA: Insufficient documentation

## 2016-08-05 LAB — COMPREHENSIVE METABOLIC PANEL
ALT: 11 U/L — AB (ref 14–54)
AST: 17 U/L (ref 15–41)
Albumin: 3.6 g/dL (ref 3.5–5.0)
Alkaline Phosphatase: 37 U/L — ABNORMAL LOW (ref 38–126)
Anion gap: 8 (ref 5–15)
BUN: 12 mg/dL (ref 6–20)
CHLORIDE: 109 mmol/L (ref 101–111)
CO2: 25 mmol/L (ref 22–32)
CREATININE: 0.59 mg/dL (ref 0.44–1.00)
Calcium: 8.7 mg/dL — ABNORMAL LOW (ref 8.9–10.3)
GFR calc Af Amer: >60 mL/min (ref >60)
GFR calc non Af Amer: >60 mL/min (ref >60)
Glucose, Bld: 103 mg/dL — ABNORMAL HIGH (ref 65–99)
Potassium: 3.6 mmol/L (ref 3.5–5.1)
SODIUM: 142 mmol/L (ref 135–145)
Total Bilirubin: 0.4 mg/dL (ref 0.3–1.2)
Total Protein: 6.6 g/dL (ref 6.5–8.1)

## 2016-08-05 LAB — URINALYSIS, MICROSCOPIC (REFLEX): RBC / HPF: NONE SEEN RBC/hpf (ref 0–5)

## 2016-08-05 LAB — CBC WITH DIFFERENTIAL/PLATELET
BASOS ABS: 0 10*3/uL (ref 0.0–0.1)
Basophils Relative: 1 %
EOS ABS: 0.4 10*3/uL (ref 0.0–0.7)
EOS PCT: 7 %
HCT: 37.1 % (ref 36.0–46.0)
HEMOGLOBIN: 12.2 g/dL (ref 12.0–15.0)
LYMPHS PCT: 32 %
Lymphs Abs: 1.5 10*3/uL (ref 0.7–4.0)
MCH: 29.5 pg (ref 26.0–34.0)
MCHC: 32.9 g/dL (ref 30.0–36.0)
MCV: 89.8 fL (ref 78.0–100.0)
Monocytes Absolute: 0.4 10*3/uL (ref 0.1–1.0)
Monocytes Relative: 9 %
NEUTROS PCT: 51 %
Neutro Abs: 2.4 10*3/uL (ref 1.7–7.7)
PLATELETS: 320 10*3/uL (ref 150–400)
RBC: 4.13 MIL/uL (ref 3.87–5.11)
RDW: 13.7 % (ref 11.5–15.5)
WBC: 4.7 10*3/uL (ref 4.0–10.5)

## 2016-08-05 LAB — URINALYSIS, ROUTINE W REFLEX MICROSCOPIC
BILIRUBIN URINE: NEGATIVE
Glucose, UA: NEGATIVE mg/dL
Hgb urine dipstick: NEGATIVE
Ketones, ur: NEGATIVE mg/dL
LEUKOCYTES UA: NEGATIVE
NITRITE: POSITIVE — AB
Protein, ur: NEGATIVE mg/dL
SPECIFIC GRAVITY, URINE: 1.022 (ref 1.005–1.030)
pH: 6 (ref 5.0–8.0)

## 2016-08-05 LAB — TSH: TSH: 1.14 u[IU]/mL (ref 0.350–4.500)

## 2016-08-05 LAB — SEDIMENTATION RATE: Sed Rate: 3 mm/hr (ref 0–22)

## 2016-08-05 LAB — PREGNANCY, URINE: PREG TEST UR: NEGATIVE

## 2016-08-05 MED ORDER — HYDROCHLOROTHIAZIDE 25 MG PO TABS: 25.0000 mg | ORAL_TABLET | Freq: Every day | ORAL | 1 refills | Status: AC

## 2016-08-05 MED ORDER — PREDNISONE 20 MG PO TABS: 40.0000 mg | ORAL_TABLET | Freq: Every day | ORAL | 0 refills | Status: AC

## 2016-08-05 MED FILL — predniSONE 20 MG TABS: 20 | 5 days supply | Qty: 10 | Fill #0

## 2016-08-05 MED FILL — HYDROCHLOROTHIAZIDE 25 MG T: 25 | 30 days supply | Qty: 30 | Fill #0

## 2016-08-05 NOTE — ED Provider Notes (Signed)
[name] DEPT [facility] Provider Note   CSN: [id number] Arrival date & time: 08/05/16  1023     History   Chief Complaint Chief Complaint  Patient presents with  . Numbness    HPI Robin Peters is a 35 y.o. female.  Patient is a 35 year old female with a history of hypertension that is currently not treated presenting today with a two-week history of gradual onset worsening bilateral foot pain. She describes the pain as sharp, aching and needle in her foot.  She has also noticed her bilateral feet and hands swelling. She does not have significant pain in her hands but does have some stiffness in the joints. Any recent antibiotic use, fever or infectious symptoms. Symptoms in the feet are worse after being on her feet all day at work but she has noticed over the last multiple days while being off and elevating her feet the pain has not gotten better but does seem to be worse at night. She has taken ibuprofen repeatedly which takes the edge off but does not alleviate the pain. Walking seems to make it worse. Patient is adopted so she does not know her family history. She denies excessive alcohol use or any exposures to chemicals.  No chest pain, shortness of breath, abdominal pain, diarrhea. She denies any dysuria but states her urine has a foul odor.   The history is provided by the patient.    Past Medical History:  Diagnosis Date  . Bartholin cyst   . Labial cyst   . Pregnancy induced hypertension   . SVT (supraventricular tachycardia) ([place])   . Trichimoniasis     Patient Active Problem List   Diagnosis Date Noted  . Palpitations 06/02/2012  . Syncope 06/02/2012  . Bartholin cyst   . Trichimoniasis   . Labial cyst   . Abdominal pain   . Back pain     Past Surgical History:  Procedure Laterality Date  . DILATION AND CURETTAGE OF UTERUS    . WISDOM TOOTH EXTRACTION      OB History    Gravida Para Term Preterm AB Living   5 3 3   2 2    SAB TAB Ectopic Multiple Live  Births   1 1     2        Home Medications    Prior to Admission medications   Not on File    Family History Family History  Problem Relation Age of Onset  . Heart disease      Adopted and unknown family history    Social History Social History  Substance Use Topics  . Smoking status: Current Every Day Smoker    Packs/day: 0.25  . Smokeless tobacco: Never Used  . Alcohol use Yes     Comment: occasional     Allergies   Stadol [butorphanol]   Review of Systems Review of Systems  All other systems reviewed and are negative.    Physical Exam Updated Vital Signs BP (!) 158/105 (BP Location: Left Arm)   Pulse 97   Temp 98.5 F (36.9 C) (Oral)   Resp 18   Ht 5' 7"  (1.702 m)   Wt 205 lb (93 kg)   SpO2 100%   BMI 32.11 kg/m   Physical Exam  Constitutional: She is oriented to person, place, and time. She appears well-developed and well-nourished. No distress.  Obese  HENT:  Head: Normocephalic and atraumatic.  Mouth/Throat: Oropharynx is clear and moist.  Eyes: Conjunctivae and EOM are normal.  Pupils are equal, round, and reactive to light.  Neck: Normal range of motion. Neck supple.  Cardiovascular: Normal rate, regular rhythm and intact distal pulses.   No murmur heard. Pulmonary/Chest: Effort normal and breath sounds normal. No respiratory distress. She has no wheezes. She has no rales.  Abdominal: Soft. She exhibits no distension. There is no tenderness. There is no rebound and no guarding.  Musculoskeletal: Normal range of motion. She exhibits tenderness. She exhibits no edema.  Tenderness diffusely over both feet with light palpation. Minimal nonpitting edema but no erythema. Minimal edema of bilateral hands but full range of motion of finger joints without reproducible tenderness.  Neurological: She is alert and oriented to person, place, and time.  Skin: Skin is warm and dry. No rash noted. No erythema.  Psychiatric: She has a normal mood and affect.  Her behavior is normal.  Nursing note and vitals reviewed.    ED Treatments / Results  Labs (all labs ordered are listed, but only abnormal results are displayed) Labs Reviewed  URINALYSIS, ROUTINE W REFLEX MICROSCOPIC - Abnormal; Notable for the following:       Result Value   APPearance CLOUDY (*)    Nitrite POSITIVE (*)    All other components within normal limits  COMPREHENSIVE METABOLIC PANEL - Abnormal; Notable for the following:    Glucose, Bld 103 (*)    Calcium 8.7 (*)    ALT 11 (*)    Alkaline Phosphatase 37 (*)    All other components within normal limits  URINALYSIS, MICROSCOPIC (REFLEX) - Abnormal; Notable for the following:    Bacteria, UA MANY (*)    Squamous Epithelial / LPF 0-5 (*)    All other components within normal limits  URINE CULTURE  PREGNANCY, URINE  CBC WITH DIFFERENTIAL/PLATELET  SEDIMENTATION RATE  TSH    EKG  EKG Interpretation None       Radiology No results found.  Procedures Procedures (including critical care time)  Medications Ordered in ED Medications - No data to display   Initial Impression / Assessment and Plan / ED Course  I have reviewed the triage vital signs and the nursing notes.  Pertinent labs & imaging results that were available during my care of the patient were reviewed by me and considered in my medical decision making (see chart for details).  Clinical Course    Patient presenting today with worsening bilateral foot pain that she describes as someone sticking a needle in her feet. Is worse with touching walking but not significantly improved with elevation and rest. Swelling improves with elevation and rest and this all started over the last 2 weeks. No known exposure medication use. Unknown family history due to patient being adopted. Patient currently takes no medications but is supposed to be on medicine for hypertension. She stopped this medicine about 2 months ago.  On exam patient has mild nonpitting  edema bilateral upper and lower extremities but no evidence suggestive of bacterial infection, gout and she denies any trauma. Concern for possible diabetes, electrolyte abnormalities, thyroid disease, autoimmune diseases such as lupus or rheumatoid arthritis. CBC, CMP, TSH, ESR pending 1:16 PM TSH still pending but rest of labs wnl.  Will treat BP and give a short course of steroids.    3:24 PM TSH wnl.  Final Clinical Impressions(s) / ED Diagnoses   Final diagnoses:  Paresthesia  Hypertension, unspecified type    New Prescriptions New Prescriptions   HYDROCHLOROTHIAZIDE (HYDRODIURIL) 25 MG TABLET    Take  1 tablet (25 mg total) by mouth daily.   PREDNISONE (DELTASONE) 20 MG TABLET    Take 2 tablets (40 mg total) by mouth daily.     Blanchie Dessert, MD 08/05/16 1524

## 2016-08-05 NOTE — ED Triage Notes (Addendum)
Pt reports bilateral fingers and feet burning and tingling pain x 2 weeks. Worsening pain at night.  Reports swelling.  No significant swelling noted in triage.   Also thinks that she has a UTI.

## 2016-08-06 LAB — URINE CULTURE

## 2016-12-03 MED FILL — HYDROCHLOROTHIAZIDE 25 MG T: 25 | 30 days supply | Qty: 30 | Fill #1

## 2018-06-19 ENCOUNTER — Encounter (HOSPITAL_BASED_OUTPATIENT_CLINIC_OR_DEPARTMENT_OTHER): Payer: Self-pay | Admitting: Emergency Medicine

## 2018-06-19 ENCOUNTER — Other Ambulatory Visit: Payer: Self-pay

## 2018-06-19 ENCOUNTER — Emergency Department (HOSPITAL_BASED_OUTPATIENT_CLINIC_OR_DEPARTMENT_OTHER)
Admission: EM | Admit: 2018-06-19 | Discharge: 2018-06-19 | Disposition: A | Discharge status: Home / Self Care | Payer: Medicaid Other | Attending: Emergency Medicine | Admitting: Emergency Medicine

## 2018-06-19 DIAGNOSIS — Y929 Unspecified place or not applicable: Secondary | ICD-10-CM | POA: Insufficient documentation

## 2018-06-19 DIAGNOSIS — S0502XA Injury of conjunctiva and corneal abrasion without foreign body, left eye, initial encounter: Secondary | ICD-10-CM | POA: Diagnosis not present

## 2018-06-19 DIAGNOSIS — F17200 Nicotine dependence, unspecified, uncomplicated: Secondary | ICD-10-CM | POA: Diagnosis not present

## 2018-06-19 DIAGNOSIS — S0500XA Injury of conjunctiva and corneal abrasion without foreign body, unspecified eye, initial encounter: Secondary | ICD-10-CM

## 2018-06-19 DIAGNOSIS — X58XXXA Exposure to other specified factors, initial encounter: Secondary | ICD-10-CM | POA: Insufficient documentation

## 2018-06-19 DIAGNOSIS — S0501XA Injury of conjunctiva and corneal abrasion without foreign body, right eye, initial encounter: Secondary | ICD-10-CM | POA: Insufficient documentation

## 2018-06-19 DIAGNOSIS — Y998 Other external cause status: Secondary | ICD-10-CM | POA: Insufficient documentation

## 2018-06-19 DIAGNOSIS — Y9389 Activity, other specified: Secondary | ICD-10-CM | POA: Insufficient documentation

## 2018-06-19 DIAGNOSIS — S0592XA Unspecified injury of left eye and orbit, initial encounter: Secondary | ICD-10-CM | POA: Diagnosis present

## 2018-06-19 MED ORDER — NAPROXEN 375 MG PO TABS: 375.0000 mg | ORAL_TABLET | Freq: Two times a day (BID) | ORAL | 0 refills | Status: AC

## 2018-06-19 MED ORDER — TOBRAMYCIN-DEXAMETHASONE 0.3-0.1 % OP SUSP: 1.0000 [drp] | OPHTHALMIC | 0 refills | Status: AC

## 2018-06-19 MED ORDER — TETRACAINE HCL 0.5 % OP SOLN
1.0000 [drp] | Freq: Once | OPHTHALMIC | Status: AC
  Administered 2018-06-19: 1 [drp] via OPHTHALMIC
  Filled 2018-06-19: qty 4

## 2018-06-19 MED ORDER — ERYTHROMYCIN 5 MG/GM OP OINT: TOPICAL_OINTMENT | OPHTHALMIC | 0 refills | Status: AC

## 2018-06-19 MED ORDER — FLUORESCEIN SODIUM 1 MG OP STRP
1.0000 | ORAL_STRIP | Freq: Once | OPHTHALMIC | Status: AC
  Administered 2018-06-19: 1 via OPHTHALMIC
  Filled 2018-06-19: qty 1

## 2018-06-19 MED ORDER — HYDROCODONE-ACETAMINOPHEN 5-325 MG PO TABS: 1.0000 | ORAL_TABLET | Freq: Four times a day (QID) | ORAL | 0 refills | Status: AC | PRN

## 2018-06-19 MED ORDER — ERYTHROMYCIN 5 MG/GM OP OINT
TOPICAL_OINTMENT | Freq: Once | OPHTHALMIC | Status: AC
Start: 2018-06-19 — End: 2018-06-19
  Administered 2018-06-19: 1 via OPHTHALMIC
  Filled 2018-06-19: qty 3.5

## 2018-06-19 MED ORDER — HYDROCODONE-ACETAMINOPHEN 5-325 MG PO TABS
2.0000 | ORAL_TABLET | Freq: Once | ORAL | Status: AC
  Administered 2018-06-19: 2 via ORAL
  Filled 2018-06-19: qty 2

## 2018-06-19 NOTE — ED Triage Notes (Signed)
Bilateral eye pain this morning. States it feels like something is in them. Also reports using a new contact solution.

## 2018-06-19 NOTE — Discharge Instructions (Signed)
Use ointment and eyedrops as directed.  You take the anti-inflammatories for pain.  Take the pain medication for severe breakthrough pain.  As we discussed, you will see Dr. Cathey Endow in his office on Monday at 8:30 AM.  Please go to his office on Monday for an his appointment.  Return to emergency department if you experience any fever, worsening pain, vision changes or any other worsening or concerning symptoms.

## 2018-06-19 NOTE — ED Provider Notes (Signed)
[facility] EMERGENCY DEPARTMENT Provider Note   CSN: [id number] Arrival date & time: 06/19/18  1501     History   Chief Complaint Chief Complaint  Patient presents with  . Eye Problem    HPI Robin Peters is a 37 y.o. female who presents for evaluation of bilateral eye pain that began today.  Patient reports that she used a new contact solution and states that she put her contacts in the solution.  She states she then put her chronic thought it felt like there was some burning irritation to her eyes.  She took the contacts out and noted that on the solution it said that you are supposed to rinse her contacts before you put him in.  Patient reports that the burning has continued to get worse.  She states that she tried to go to work but states that she was having difficulty seeing and opening her eyes secondary to pain.  She states that she did excellently run into a mattress but states that it did not cause any worsening of symptoms.  Patient does report that she wears contacts but no glasses.  She has had photophobia, eye pain and difficulty seeing since the incident.  Patient denies any fevers.   The history is provided by the patient.    Past Medical History:  Diagnosis Date  . Bartholin cyst   . Labial cyst   . Pregnancy induced hypertension   . SVT (supraventricular tachycardia) (HCC)   . Trichimoniasis     Patient Active Problem List   Diagnosis Date Noted  . Palpitations 06/02/2012  . Syncope 06/02/2012  . Bartholin cyst   . Trichimoniasis   . Labial cyst   . Abdominal pain   . Back pain     Past Surgical History:  Procedure Laterality Date  . DILATION AND CURETTAGE OF UTERUS    . WISDOM TOOTH EXTRACTION       OB History    Gravida  5   Para  3   Term  3   Preterm      AB  2   Living  2     SAB  1   TAB  1   Ectopic      Multiple      Live Births  2            Home Medications    Prior to Admission medications     Medication Sig Start Date End Date Taking? Authorizing Provider  erythromycin ophthalmic ointment Place a 1/2 inch ribbon of ointment into the lower eyelid of both eyes three times a day. 06/19/18   Maxwell Caul, PA-C  hydrochlorothiazide (HYDRODIURIL) 25 MG tablet Take 1 tablet (25 mg total) by mouth daily. 08/05/16   Gwyneth Sprout, MD  HYDROcodone-acetaminophen (NORCO/VICODIN) 5-325 MG tablet Take 1-2 tablets by mouth every 6 (six) hours as needed. 06/19/18   Maxwell Caul, PA-C  naproxen (NAPROSYN) 375 MG tablet Take 1 tablet (375 mg total) by mouth 2 (two) times daily. 06/19/18   Maxwell Caul, PA-C  predniSONE (DELTASONE) 20 MG tablet Take 2 tablets (40 mg total) by mouth daily. 08/05/16   Gwyneth Sprout, MD  tobramycin-dexamethasone [facility]) ophthalmic solution Place 1 drop into both eyes every 4 (four) hours while awake. 06/19/18   Maxwell Caul, PA-C    Family History Family History  Problem Relation Age of Onset  . Heart disease Unknown        Adopted  and unknown family history    Social History Social History   Tobacco Use  . Smoking status: Current Every Day Smoker    Packs/day: 0.25  . Smokeless tobacco: Never Used  Substance Use Topics  . Alcohol use: Yes    Comment: occasional  . Drug use: No     Allergies   Stadol [butorphanol]   Review of Systems Review of Systems  Constitutional: Negative for fever.  Eyes: Positive for photophobia, pain, redness, itching and visual disturbance.  All other systems reviewed and are negative.    Physical Exam Updated Vital Signs BP (!) 156/100 Comment: has not taken BP meds today  Pulse 74   Temp 97.7 F (36.5 C) (Oral)   Resp 16   Ht 5\' 7"  (1.702 m)   Wt 86.2 kg   SpO2 100%   BMI 29.76 kg/m   Physical Exam  Constitutional: She appears well-developed and well-nourished.  HENT:  Head: Normocephalic and atraumatic.  Eyes: Pupils are equal, round, and reactive to light. Conjunctivae and EOM  are normal. Right eye exhibits no discharge. Left eye exhibits no discharge. No scleral icterus.  Slit lamp exam:      The right eye shows fluorescein uptake.       The left eye shows fluorescein uptake.  Mild swelling noted to the lids bilaterally.  No overlying warmth, erythema.  No tenderness palpation noted periorbital region.  EOMs intact any difficulty.  Pulmonary/Chest: Effort normal.  Neurological: She is alert.  Skin: Skin is warm and dry.  Psychiatric: She has a normal mood and affect. Her speech is normal and behavior is normal.  Nursing note and vitals reviewed.    ED Treatments / Results  Labs (all labs ordered are listed, but only abnormal results are displayed) Labs Reviewed - No data to display  EKG None  Radiology No results found.  Procedures Procedures (including critical care time)  Medications Ordered in ED Medications  tetracaine (PONTOCAINE) 0.5 % ophthalmic solution 1 drop (1 drop Both Eyes Given by Other 06/19/18 1854)  fluorescein ophthalmic strip 1 strip (1 strip Both Eyes Given by Other 06/19/18 1853)  HYDROcodone-acetaminophen (NORCO/VICODIN) 5-325 MG per tablet 2 tablet (2 tablets Oral Given 06/19/18 1816)  erythromycin ophthalmic ointment (1 application Both Eyes Given 06/19/18 1853)     Initial Impression / Assessment and Plan / ED Course  I have reviewed the triage vital signs and the nursing notes.  Pertinent labs & imaging results that were available during my care of the patient were reviewed by me and considered in my medical decision making (see chart for details).     37 year old female who presents for evaluation of bilateral eye pain that began today.  Reports using a new contact solution and then putting her contacts in.  Has since then had pain, photophobia, redness.  Reports decreased vision secondary to pain.  No fevers. Patient is afebrile, non-toxic appearing, sitting comfortably on examination table. Vital signs reviewed and  stable.  On exam, EOMS intact and PERRL.  No evidence of foreign body.  Concern for corneal abrasion versus allergic reaction.  History/physical exam is not concerning for conjunctivitis.  History/physical exam is not concerning for preseptal or orbital cellulitis.  Patient with Joseph Art lamp.  There is evidence of floor seen uptake and corneal abrasion on bilateral eyes.  No Seidel sign.  No dendritic lesions.  No evidence of foreign body on slit-lamp exam.  Her pH is 7 bilaterally.  Intraocular pressure as documented below:  Left IOP: 16, 18 Right IOP: 14, 15  Visual acuity was unable to be assessed as patient sees primarily with contacts or glasses.  She has neither here in the ED.  Discussed patient with Dr. Cathey Endow (ophthalmology).  He recommends doing erythromycin ointment 3 times a day.  He will plan to see her in the office on Monday at 8:30 AM.  Discussed with patient.  Encourage patient on at home supportive care measures.  We will add tobramycin for Pseudomonas coverage. Patient had ample opportunity for questions and discussion. All patient's questions were answered with full understanding. Strict return precautions discussed. Patient expresses understanding and agreement to plan.   Final Clinical Impressions(s) / ED Diagnoses   Final diagnoses:  Corneal abrasion, unspecified laterality, initial encounter    ED Discharge Orders         Ordered    erythromycin ophthalmic ointment     06/19/18 1848    tobramycin-dexamethasone (TOBRADEX) ophthalmic solution  Every 4 hours while awake     06/19/18 1848    naproxen (NAPROSYN) 375 MG tablet  2 times daily     06/19/18 1848    HYDROcodone-acetaminophen (NORCO/VICODIN) 5-325 MG tablet  Every 6 hours PRN     06/19/18 1848           Maxwell Caul, PA-C 06/19/18 1909    Arby Barrette, MD 06/21/18 0020

## 2018-06-19 NOTE — ED Notes (Signed)
Pt/family verbalized understanding of discharge instructions.   

## 2018-06-19 NOTE — ED Notes (Signed)
Pt states she cannot open her eyes to do a visual acuity due to pain.

## 2018-06-19 NOTE — ED Notes (Signed)
Unable to complete visual acuity due to pt wearing contacts and not having glasses.

## 2019-07-27 ENCOUNTER — Other Ambulatory Visit: Payer: Self-pay

## 2019-07-27 ENCOUNTER — Encounter (HOSPITAL_BASED_OUTPATIENT_CLINIC_OR_DEPARTMENT_OTHER): Payer: Self-pay

## 2019-07-27 ENCOUNTER — Emergency Department (HOSPITAL_BASED_OUTPATIENT_CLINIC_OR_DEPARTMENT_OTHER)
Admission: EM | Admit: 2019-07-27 | Discharge: 2019-07-27 | Disposition: A | Discharge status: Home / Self Care | Payer: Medicaid Other | Attending: Emergency Medicine | Admitting: Emergency Medicine

## 2019-07-27 DIAGNOSIS — F17210 Nicotine dependence, cigarettes, uncomplicated: Secondary | ICD-10-CM | POA: Diagnosis not present

## 2019-07-27 DIAGNOSIS — Z79899 Other long term (current) drug therapy: Secondary | ICD-10-CM | POA: Insufficient documentation

## 2019-07-27 DIAGNOSIS — Z202 Contact with and (suspected) exposure to infections with a predominantly sexual mode of transmission: Secondary | ICD-10-CM | POA: Insufficient documentation

## 2019-07-27 LAB — URINALYSIS, ROUTINE W REFLEX MICROSCOPIC
Bilirubin Urine: NEGATIVE
Glucose, UA: NEGATIVE mg/dL
Hgb urine dipstick: NEGATIVE
Ketones, ur: NEGATIVE mg/dL
Leukocytes,Ua: NEGATIVE
Nitrite: NEGATIVE
Protein, ur: NEGATIVE mg/dL
Specific Gravity, Urine: <1.005 — ABNORMAL LOW (ref 1.005–1.030)
pH: 6 (ref 5.0–8.0)

## 2019-07-27 LAB — WET PREP, GENITAL
Sperm: NONE SEEN
Trich, Wet Prep: NONE SEEN
Yeast Wet Prep HPF POC: NONE SEEN

## 2019-07-27 LAB — PREGNANCY, URINE: Preg Test, Ur: NEGATIVE

## 2019-07-27 NOTE — ED Provider Notes (Signed)
[facility] DEPART[facility] Provider Note   CSN: [id number] Arrival date & time: 07/27/19  1424     History   Chief Complaint Chief Complaint  Patient presents with  . Exposure to STD    HPI Robin Peters is a 38 y.o. female who presents for evaluation of concerns for STD exposure.  She reports that a partner who she last had intercourse with about 3 to 4 months ago contacted her and told her he was positive for gonorrhea.  She states that she has not had any symptoms but wanted to get checked out.  She has not noted any vaginal bleeding or vaginal discharge.  Her LMP was 07/16/19.  She states she has not had any dysuria or hematuria.  Denies any fevers, abdominal pain.  She has not had any other sexual partners since then.    The history is provided by the patient.    Past Medical History:  Diagnosis Date  . Bartholin cyst   . Labial cyst   . Pregnancy induced hypertension   . SVT (supraventricular tachycardia) ([place])   . Trichimoniasis     Patient Active Problem List   Diagnosis Date Noted  . Palpitations 06/02/2012  . Syncope 06/02/2012  . Bartholin cyst   . Trichimoniasis   . Labial cyst   . Abdominal pain   . Back pain     Past Surgical History:  Procedure Laterality Date  . DILATION AND CURETTAGE OF UTERUS    . WISDOM TOOTH EXTRACTION       OB History    Gravida  5   Para  3   Term  3   Preterm      AB  2   Living  2     SAB  1   TAB  1   Ectopic      Multiple      Live Births  2            Home Medications    Prior to Admission medications   Medication Sig Start Date End Date Taking? Authorizing Provider  erythromycin ophthalmic ointment Place a 1/2 inch ribbon of ointment into the lower eyelid of both eyes three times a day. 06/19/18   Volanda Napoleon, PA-C  hydrochlorothiazide (HYDRODIURIL) 25 MG tablet Take 1 tablet (25 mg total) by mouth daily. 08/05/16   Blanchie Dessert, MD  HYDROcodone-acetaminophen  (NORCO/VICODIN) 5-325 MG tablet Take 1-2 tablets by mouth every 6 (six) hours as needed. 06/19/18   Volanda Napoleon, PA-C  naproxen (NAPROSYN) 375 MG tablet Take 1 tablet (375 mg total) by mouth 2 (two) times daily. 06/19/18   Volanda Napoleon, PA-C  predniSONE (DELTASONE) 20 MG tablet Take 2 tablets (40 mg total) by mouth daily. 08/05/16   Blanchie Dessert, MD  tobramycin-dexamethasone [facility]) ophthalmic solution Place 1 drop into both eyes every 4 (four) hours while awake. 06/19/18   Volanda Napoleon, PA-C    Family History Family History  Problem Relation Age of Onset  . Heart disease Other        Adopted and unknown family history    Social History Social History   Tobacco Use  . Smoking status: Current Every Day Smoker  . Smokeless tobacco: Never Used  Substance Use Topics  . Alcohol use: Yes    Comment: weekly  . Drug use: No     Allergies   Stadol [butorphanol]   Review of Systems Review of Systems  Constitutional:  Negative for fever.  Gastrointestinal: Negative for abdominal pain.  Genitourinary: Negative for dysuria, hematuria, vaginal bleeding and vaginal discharge.  All other systems reviewed and are negative.    Physical Exam Updated Vital Signs BP (!) 156/107 (BP Location: Left Arm)   Pulse 91   Temp 98.9 F (37.2 C) (Oral)   Resp 20   Ht 5\' 7"  (1.702 m)   Wt 81.6 kg   LMP 07/16/2019   SpO2 100%   BMI 28.19 kg/m   Physical Exam Vitals signs and nursing note reviewed. Exam conducted with a chaperone present.  Constitutional:      Appearance: She is well-developed.  HENT:     Head: Normocephalic and atraumatic.  Eyes:     General: No scleral icterus.       Right eye: No discharge.        Left eye: No discharge.     Conjunctiva/sclera: Conjunctivae normal.  Pulmonary:     Effort: Pulmonary effort is normal.  Abdominal:     Comments: Abdomen is soft, non-distended, non-tender. No rigidity, No guarding. No peritoneal signs.   Genitourinary:    Vagina: Normal. No vaginal discharge.     Cervix: No cervical motion tenderness or discharge.     Adnexa: Right adnexa normal and left adnexa normal.       Right: No mass or tenderness.         Left: No mass.       Comments: The exam was performed with a chaperone present. Normal external female genitalia. No lesions, rash, or sores.  No CMT.  No adnexal mass or tenderness noted bilaterally. Skin:    General: Skin is warm and dry.  Neurological:     Mental Status: She is alert.  Psychiatric:        Speech: Speech normal.        Behavior: Behavior normal.      ED Treatments / Results  Labs (all labs ordered are listed, but only abnormal results are displayed) Labs Reviewed  WET PREP, GENITAL - Abnormal; Notable for the following components:      Result Value   Clue Cells Wet Prep HPF POC PRESENT (*)    WBC, Wet Prep HPF POC MODERATE (*)    All other components within normal limits  URINALYSIS, ROUTINE W REFLEX MICROSCOPIC - Abnormal; Notable for the following components:   Specific Gravity, Urine <1.005 (*)    All other components within normal limits  PREGNANCY, URINE  GC/CHLAMYDIA PROBE AMP ([place]) NOT AT [facility]    EKG None  Radiology No results found.  Procedures Procedures (including critical care time)  Medications Ordered in ED Medications - No data to display   Initial Impression / Assessment and Plan / ED Course  I have reviewed the triage vital signs and the nursing notes.  Pertinent labs & imaging results that were available during my care of the patient were reviewed by me and considered in my medical decision making (see chart for details).        38 year old female who presents for evaluation of concern for STD exposure.  She states former Hartner contacted and said he was positive for STDs.  She has not had symptoms wanted to get checked out. Patient is afebrile, non-toxic appearing, sitting comfortably on examination table.  Vital signs reviewed and stable.  Benign abdominal exam.  Plan for pelvic, urine.  Pelvic exam as document above.  No CMT.  No adnexal mass or tenderness.  Exam  not concerning for PID, ovarian torsion/ovarian cyst.  UA negative for any infectious etiology.  Urine pregnancy is negative.  Wet prep shows clue cells she is not symptomatic. Will not treat at this time.   Discussed treatment options with patient.  She would like to wait until her results come back in order to get treatment.  I feel that this is reasonable. At this time, patient exhibits no emergent life-threatening condition that require further evaluation in ED or admission. Patient had ample opportunity for questions and discussion. All patient's questions were answered with full understanding. Strict return precautions discussed. Patient expresses understanding and agreement to plan.   Portions of this note were generated with Scientist, clinical (histocompatibility and immunogenetics)Dragon dictation software. Dictation errors may occur despite best attempts at proofreading.   Final Clinical Impressions(s) / ED Diagnoses   Final diagnoses:  Possible exposure to STD    ED Discharge Orders    None       Rosana HoesLayden, Lindsey A, PA-C 07/27/19 1729    Cathren LaineSteinl, Kevin, MD 07/27/19 [id number]

## 2019-07-27 NOTE — Discharge Instructions (Signed)
You have been tested today for an STD.   The test results with take 2-3 days to return. If there is an abnormal result, you will be notified. If you do not hear anything, that means the results were negative. You can also log on MyChart to see the results.   Your sexual partner needs to be treated too. Do not have sexual intercourse for the next 7 days and after your partner has been treated.   Follow-up with your primary care doctor in 2-4 days. If you do not have a primary care doctor, you can use one listed in the paperwork.   Return to the Emergency Department for any fever, abdominal pain, difficulty breathing, nausea/vomiting or any other worsening or concerning symptoms.

## 2019-07-27 NOTE — ED Triage Notes (Signed)
Pt reports +STD exposure-denies vaginal d/c and pain-NAD-steady gait

## 2019-07-29 LAB — GC/CHLAMYDIA PROBE AMP (~~LOC~~) NOT AT ARMC
Chlamydia: NEGATIVE
Neisseria Gonorrhea: NEGATIVE
